# Patient Record
Sex: Male | Born: 1950 | Race: Black or African American | Hispanic: No | Marital: Married | Smoking: Former smoker
Health system: Southern US, Community
[De-identification: ages and names within clinical notes are randomized; demographics above are authoritative.]

## PROBLEM LIST (undated history)

## (undated) DIAGNOSIS — Z8719 Personal history of other diseases of the digestive system: Secondary | ICD-10-CM

## (undated) DIAGNOSIS — E7800 Pure hypercholesterolemia, unspecified: Secondary | ICD-10-CM

## (undated) DIAGNOSIS — K219 Gastro-esophageal reflux disease without esophagitis: Secondary | ICD-10-CM

## (undated) DIAGNOSIS — M4800 Spinal stenosis, site unspecified: Secondary | ICD-10-CM

## (undated) DIAGNOSIS — M1990 Unspecified osteoarthritis, unspecified site: Secondary | ICD-10-CM

## (undated) DIAGNOSIS — H409 Unspecified glaucoma: Secondary | ICD-10-CM

## (undated) DIAGNOSIS — I10 Essential (primary) hypertension: Secondary | ICD-10-CM

## (undated) HISTORY — PX: BACK SURGERY: SHX140

## (undated) HISTORY — PX: EYE SURGERY: SHX253

---

## 2008-02-20 ENCOUNTER — Inpatient Hospital Stay (HOSPITAL_COMMUNITY): Admission: AD | Admit: 2008-02-20 | Discharge: 2008-02-21 | Payer: Self-pay | Admitting: Specialist

## 2010-10-18 NOTE — Op Note (Signed)
NAME:  IZAAK, SAHR NO.:  [id number]   MEDICAL RECORD NO.:  [id number]          PATIENT TYPE:  AMB   LOCATION:  DAY                          FACILITY:  [facility]   PHYSICIAN:  Jene Every, M.D.    DATE OF BIRTH:  1950/10/27   DATE OF PROCEDURE:  02/19/2008  DATE OF DISCHARGE:                               OPERATIVE REPORT   PREOPERATIVE DIAGNOSIS:  Spinal stenosis at L4-5 and L3-4.   POSTOPERATIVE DIAGNOSIS:  Spinal stenosis at L45 and L3-4, lateral  recess at 5-1.   PROCEDURE PERFORMED:  1. Lumbar decompression centrally at L3-4 and L4-5, bilaminectomy of 4      and of 5.  2. Redo decompression at L5-S1.   ANESTHESIA:  General.   ASSISTANT:  Roma Schanz, P.A.   BRIEF HISTORY AND INDICATIONS:  The patient is a 60 year old who has had  a history of a fusion at L5-S1 and has done well with that.  Developed  neurogenic claudication secondary to adjacent spinal stenosis at 4-5  extending to 3-4 with a hyperlordotic lumbar spine.  He had  predominantly left lower extremity radicular pain in the L4 and L5 nerve  root distribution.  There was a remnant of the neural arch of 5  remaining from his previous central decompression and fusion at 5-1.  This was felt to be the area of significant pathology.  Risks and  benefits were discussed including bleeding, infection, damage to  neurovascular structures, CSF leakage, epidural fibrosis, adjacent  segment disease and the need for fusion in future, anesthetic  complications, etc.   TECHNIQUE:  The patient in supine position.  After the induction of  adequate anesthesia and 2 g of Kefzol, was placed prone on the [place]  frame.  All bony prominences were well padded.  Lumbar region was  prepped and draped in usual sterile fashion.  The previous surgical  incision was utilized partially, excised the old scar and extended  cephalad.  Subcutaneous tissue was dissected.  Electrocautery was  utilized to achieve  hemostasis.  The spinous processes of 3-4 and the  residual of 5 were identified.  Paraspinous muscles elevated.  The scar  tissue was removed which was extensive down at 4-5.  McCullough  retractors were placed.  We had an x-ray first, identifying the spinous  process of 4.  Operating microscope was draped and brought in the  surgical field.  There was shingling and overlap of the spinous process  of 4 predominantly with 5 and 3, removed the spinous process of 4 with a  Leksell rongeur.  The residual lamina of 5 was noted as well.  There was  shingling at 3-4.  After removing the spinous processes of 4, we used a  straight curette to skeletonize the lamina of 3, 4 and 5.  We used a 2-  mm Kerrison at 4-5 proceeding cephalad first centrally and then using  neural patty to protect the neural elements.  There was hypertrophic  epidural lipomatosis noted as well, and significant hypertrophic  ligamentum and facet hypertrophy at 4.  Performed central laminectomy of  4, decompressed lateral recesses to the medial border of the pedicle,  left stenotic greater than right.  Performed foraminotomies of 4,  continued cephalad removing the entire lamina of 4.  Hypertrophic  ligamentum flavum was noted at 3-4.  Ligamentum flavum was then removed  at the 3-4 space, and the lateral recesses were decompressed at the  medial border of the pedicle.  I then was able to pass a hockey stick  cephalad to the pedicle of 3 without difficulty and out the foramen of  4.  Some residual of the neural arch at 5 was remaining.  There was  significant lateral recess and foraminal stenosis, particularly at 5 and  the 5 root on the left, given the predominance of symptoms was L5  related on the left.  We continued with the resection of the neural arch  at 5 and mobilized epidural fibrosis from beneath the neural arch of 5  and then meticulously removed the residual L5 lamina with a 2-mm  Kerrison extending into the 5-1  space.  This appeared to be a napkin-  ring like effect for the adjacent segment.  There was significant  stenosis in the lateral recess.  The nerve root was protected.  We  mobilized the epidural fibrosis with a Penfield 4.  Protecting the root,  we then performed a foraminotomy of 5 and decompressed the lateral  recess to the medial border of the pedicle, freeing the root of 5.  This  was also performed on the right as well.  There was restoration of the  thecal sac and no constriction noted following the resection of 5 and  the decompression of 5-1 on the left.  Wound copiously irrigated.  Inspection revealed no active bleeding.  The hockey-stick probe passed  freely in the foramen of 5, 4, and 3 bilaterally.  There was no CSF  leakage or active bleeding.  Thrombin-soaked Gelfoam was placed in the  laminotomy defect.  McCullough retractor was removed.  Paraspinous  muscle inspected with no active bleeding with cautery utilized to  achieve hemostasis.  Wound copiously irrigated.  Repaired the dorsal  lumbar fascia with #1 Vicryl interrupted figure-of-eight sutures,  subcutaneous tissue reapproximated with 2-0 Vicryl simple sutures.  Skin  reapproximated with staples.  Wound was dressed sterilely.  Placed  supine on the hospital bed, extubated without difficulty, and  transported to recovery room in satisfactory condition.   The patient tolerated the procedure well with no complications.   BLOOD LOSS:  150 mL.      Jene Every, M.D.  Electronically Signed     JB/MEDQ  D:  02/19/2008  T:  02/20/2008  Job:  [id number]

## 2011-03-06 ENCOUNTER — Ambulatory Visit: Payer: 59 | Attending: Physician Assistant | Admitting: Physical Therapy

## 2011-03-06 DIAGNOSIS — M25676 Stiffness of unspecified foot, not elsewhere classified: Secondary | ICD-10-CM | POA: Insufficient documentation

## 2011-03-06 DIAGNOSIS — IMO0001 Reserved for inherently not codable concepts without codable children: Secondary | ICD-10-CM | POA: Insufficient documentation

## 2011-03-06 DIAGNOSIS — M25673 Stiffness of unspecified ankle, not elsewhere classified: Secondary | ICD-10-CM | POA: Insufficient documentation

## 2011-03-06 DIAGNOSIS — M25579 Pain in unspecified ankle and joints of unspecified foot: Secondary | ICD-10-CM | POA: Insufficient documentation

## 2011-03-06 LAB — BASIC METABOLIC PANEL WITH GFR
BUN: 7
CO2: 30
Calcium: 8.7
Chloride: 105
Creatinine, Ser: 1.36
GFR calc Af Amer: >60
GFR calc non Af Amer: 54 — ABNORMAL LOW
Glucose, Bld: 99
Potassium: 3.9
Sodium: 139

## 2011-03-06 LAB — CBC
HCT: 38.7 — ABNORMAL LOW
Hemoglobin: 12.9 — ABNORMAL LOW
MCHC: 33.4
MCV: 88.7
Platelets: 268
RBC: 4.36
RDW: 14.7
WBC: 10.8 — ABNORMAL HIGH

## 2011-03-08 ENCOUNTER — Ambulatory Visit: Payer: 59 | Admitting: Physical Therapy

## 2011-03-08 LAB — CBC
HCT: 45
MCHC: 33
MCV: 89.4
Platelets: 306
RDW: 14.9
WBC: 8.9

## 2011-03-08 LAB — URINALYSIS, ROUTINE W REFLEX MICROSCOPIC
Hgb urine dipstick: NEGATIVE
Ketones, ur: NEGATIVE
Protein, ur: NEGATIVE
Urobilinogen, UA: 0.2

## 2011-03-08 LAB — COMPREHENSIVE METABOLIC PANEL
AST: 24
Albumin: 3.9
BUN: 11
Calcium: 9.5
Chloride: 105
Creatinine, Ser: 1.32
GFR calc Af Amer: >60
Total Protein: 7

## 2011-03-08 LAB — PROTIME-INR: INR: 0.9

## 2011-03-08 LAB — APTT: aPTT: 31

## 2011-03-14 ENCOUNTER — Ambulatory Visit: Payer: 59 | Admitting: Physical Therapy

## 2011-03-16 ENCOUNTER — Ambulatory Visit: Payer: 59 | Admitting: Physical Therapy

## 2011-03-20 ENCOUNTER — Ambulatory Visit: Payer: 59 | Admitting: Physical Therapy

## 2011-03-22 ENCOUNTER — Ambulatory Visit: Payer: 59 | Admitting: Physical Therapy

## 2011-03-27 ENCOUNTER — Ambulatory Visit: Payer: 59 | Admitting: Physical Therapy

## 2011-03-29 ENCOUNTER — Ambulatory Visit: Payer: 59 | Admitting: Physical Therapy

## 2011-04-04 ENCOUNTER — Ambulatory Visit: Payer: 59 | Admitting: Physical Therapy

## 2011-04-07 ENCOUNTER — Ambulatory Visit: Payer: 59 | Attending: Physician Assistant | Admitting: Physical Therapy

## 2011-04-07 DIAGNOSIS — M25676 Stiffness of unspecified foot, not elsewhere classified: Secondary | ICD-10-CM | POA: Insufficient documentation

## 2011-04-07 DIAGNOSIS — IMO0001 Reserved for inherently not codable concepts without codable children: Secondary | ICD-10-CM | POA: Insufficient documentation

## 2011-04-07 DIAGNOSIS — M25673 Stiffness of unspecified ankle, not elsewhere classified: Secondary | ICD-10-CM | POA: Insufficient documentation

## 2011-04-07 DIAGNOSIS — M25579 Pain in unspecified ankle and joints of unspecified foot: Secondary | ICD-10-CM | POA: Insufficient documentation

## 2011-04-13 ENCOUNTER — Ambulatory Visit: Payer: 59 | Admitting: Physical Therapy

## 2011-04-21 ENCOUNTER — Ambulatory Visit: Payer: 59 | Admitting: Physical Therapy

## 2011-06-06 HISTORY — PX: TOE SURGERY: SHX1073

## 2012-06-05 HISTORY — PX: INJECTION KNEE: SHX2446

## 2014-02-05 ENCOUNTER — Encounter (HOSPITAL_COMMUNITY): Payer: Self-pay | Admitting: Pharmacy Technician

## 2014-02-06 ENCOUNTER — Ambulatory Visit: Payer: Self-pay | Admitting: Orthopedic Surgery

## 2014-02-11 ENCOUNTER — Ambulatory Visit: Payer: Self-pay | Admitting: Orthopedic Surgery

## 2014-02-11 ENCOUNTER — Encounter (HOSPITAL_COMMUNITY): Payer: Self-pay | Admitting: Pharmacy Technician

## 2014-02-12 ENCOUNTER — Ambulatory Visit: Payer: Self-pay | Admitting: Orthopedic Surgery

## 2014-02-12 NOTE — H&P (Signed)
Mitchell Green is an 63 y.o. male.   Chief Complaint: back and bilateral leg pain HPI: The patient is a 62 year old male who presents today for follow up of their back. The patient is being followed for their back pain. They are now 1 1/2 month out from most recent flare up. Symptoms reported today include: pain. Current treatment includes: NSAIDs (Advil). The following medication has been used for pain control: Norco (prn). The patient reports their current pain level to be moderate to severe. The patient presents today following MRI.  He has pain down both legs. It is difficult to walk. He is better when he sits. No change in bowel or bladder function, fevers or chills. He is six weeks from his flare up. He is here with his wife.  No past medical history on file.  No past surgical history on file.  No family history on file. Social History:  has no tobacco, alcohol, and drug history on file.  Allergies:  Allergies  Allergen Reactions  . Procaine     Reaction unknown.      (Not in a hospital admission)  No results found for this or any previous visit (from the past 48 hour(s)). No results found.  Review of Systems  Constitutional: Negative.   HENT: Negative.   Eyes: Negative.   Respiratory: Negative.   Cardiovascular: Negative.   Gastrointestinal: Negative.   Genitourinary: Negative.   Musculoskeletal: Positive for back pain.  Skin: Negative.   Neurological: Negative.   Psychiatric/Behavioral: Negative.     There were no vitals taken for this visit. Physical Exam  Constitutional: He is oriented to person, place, and time. He appears well-developed and well-nourished.  HENT:  Head: Normocephalic and atraumatic.  Eyes: Conjunctivae and EOM are normal. Pupils are equal, round, and reactive to light.  Neck: Normal range of motion. Neck supple.  Cardiovascular: Normal rate and regular rhythm.   Respiratory: Effort normal and breath sounds normal.  GI: Soft. Bowel  sounds are normal.  Musculoskeletal:  On exam, he is upright and sitting and in mild distress. He has pain with extension and relieved with forward flexion. Lumbar spine exam reveals no evidence of soft tissue swelling, ecchymosis or deformity. On palpation there is no flank pain with percussion. Nontender over the trochanters.  Neurological: He is alert and oriented to person, place, and time. He has normal reflexes.  Skin: Skin is warm and dry.    MRI demonstrates severe multifactorial stenosis at 3-4. Laminectomy defect at 4-5. Small disc protrusion at 4-5. Recent discectomy at 5-1 and fusion. Clumping of the nerve roots at 2-3 with consistent high grade stenosis at 3-4.  Assessment/Plan Spinal stenosis Neurogenic claudication secondary to severe spinal stenosis at 3-4 adjacent to the segment of a decompression. No instability with flexion or extension. History of a fusion at 5-1.  We discussed options in extensive detail. Over 25 minutes was dedicated to this discussion. We discussed decompression at 3-4 and possibly at 2-3. If there are any changes in the symptoms in the interim he is to call. Continue on analgesics.  The patient called and wanted to proceed with surgery. I called him at home and discussed with him the symptoms. He is mainly getting symptoms down the left leg, worse when he walks, relief when he sits. He does have stenosis at 3-4, severe. Discussed decompression at 3-4 and 2-3. He would like to start the scheduling process and we will proceed accordingly.  Dr. Shelle Iron discussed risks,  complications and alternatives with the pt including but not limited to DVT, PE, infx, bleeding, failure of procedure, need for secondary procedure, dural tear, CSF leak, complex regional pain syndrome, anesthesia risk, even death. All questions were answered and he desires to proceed.   Plan microlumbar decompression L3-4, possible L2-3 and L4-5   BISSELL, JACLYN M. PA-C for Dr.  Shelle Iron 02/12/2014, 7:21 AM

## 2014-02-13 ENCOUNTER — Ambulatory Visit (HOSPITAL_COMMUNITY)
Admission: RE | Admit: 2014-02-13 | Discharge: 2014-02-13 | Disposition: A | Discharge status: Home / Self Care | Payer: 59 | Source: Ambulatory Visit | Attending: Anesthesiology | Admitting: Anesthesiology

## 2014-02-13 ENCOUNTER — Ambulatory Visit (HOSPITAL_COMMUNITY)
Admission: RE | Admit: 2014-02-13 | Discharge: 2014-02-13 | Disposition: A | Discharge status: Home / Self Care | Payer: 59 | Source: Ambulatory Visit | Attending: Orthopedic Surgery | Admitting: Orthopedic Surgery

## 2014-02-13 ENCOUNTER — Encounter (HOSPITAL_COMMUNITY)
Admission: RE | Admit: 2014-02-13 | Discharge: 2014-02-13 | Disposition: A | Discharge status: Home / Self Care | Payer: 59 | Source: Ambulatory Visit | Attending: Specialist | Admitting: Specialist

## 2014-02-13 ENCOUNTER — Encounter (HOSPITAL_COMMUNITY): Payer: Self-pay

## 2014-02-13 DIAGNOSIS — I10 Essential (primary) hypertension: Secondary | ICD-10-CM | POA: Insufficient documentation

## 2014-02-13 DIAGNOSIS — Z01818 Encounter for other preprocedural examination: Secondary | ICD-10-CM | POA: Diagnosis not present

## 2014-02-13 HISTORY — DX: Essential (primary) hypertension: I10

## 2014-02-13 HISTORY — DX: Unspecified glaucoma: H40.9

## 2014-02-13 HISTORY — DX: Pure hypercholesterolemia, unspecified: E78.00

## 2014-02-13 HISTORY — DX: Personal history of other diseases of the digestive system: Z87.19

## 2014-02-13 HISTORY — DX: Spinal stenosis, site unspecified: M48.00

## 2014-02-13 HISTORY — DX: Gastro-esophageal reflux disease without esophagitis: K21.9

## 2014-02-13 LAB — CBC
HEMATOCRIT: 42.7 % (ref 39.0–52.0)
HEMOGLOBIN: 14.8 g/dL (ref 13.0–17.0)
MCH: 29.4 pg (ref 26.0–34.0)
MCHC: 34.7 g/dL (ref 30.0–36.0)
MCV: 84.9 fL (ref 78.0–100.0)
Platelets: 327 10*3/uL (ref 150–400)
RBC: 5.03 MIL/uL (ref 4.22–5.81)
RDW: 14.9 % (ref 11.5–15.5)
WBC: 6.1 10*3/uL (ref 4.0–10.5)

## 2014-02-13 LAB — BASIC METABOLIC PANEL
ANION GAP: 13 (ref 5–15)
BUN: 18 mg/dL (ref 6–23)
CHLORIDE: 101 meq/L (ref 96–112)
CO2: 26 meq/L (ref 19–32)
Calcium: 9.8 mg/dL (ref 8.4–10.5)
Creatinine, Ser: 1.23 mg/dL (ref 0.50–1.35)
GFR calc non Af Amer: 60 mL/min — ABNORMAL LOW (ref >90)
GFR, EST AFRICAN AMERICAN: 70 mL/min — AB (ref >90)
Glucose, Bld: 100 mg/dL — ABNORMAL HIGH (ref 70–99)
Potassium: 3.8 mEq/L (ref 3.7–5.3)
SODIUM: 140 meq/L (ref 137–147)

## 2014-02-13 LAB — SURGICAL PCR SCREEN
MRSA, PCR: NEGATIVE
Staphylococcus aureus: NEGATIVE

## 2014-02-13 NOTE — Progress Notes (Signed)
02-13-14 1635 EKG done today  shown to Dr. Okey Dupre with anesthesia. He wants the pt to be seen by cardiology prior to surgery.  Spoke with Andrez Grime PA at [facility] and she will talk to Rosalva Ferron to plan cardiology visit. [facility] will speak to pt.

## 2014-02-13 NOTE — Anesthesia Preprocedure Evaluation (Addendum)
Anesthesia Evaluation  Patient identified by MRN, date of birth, ID band Patient awake    Reviewed: Allergy & Precautions, H&P , NPO status , Patient's Chart, lab work & pertinent test results  Airway Mallampati: II TM Distance: >3 FB Neck ROM: Full    Dental no notable dental hx.    Pulmonary neg pulmonary ROS, former smoker,  breath sounds clear to auscultation  Pulmonary exam normal       Cardiovascular hypertension, Pt. on medications Rhythm:Regular Rate:Normal     Neuro/Psych negative neurological ROS  negative psych ROS   GI/Hepatic Neg liver ROS, hiatal hernia, GERD-  ,  Endo/Other  negative endocrine ROS  Renal/GU negative Renal ROS     Musculoskeletal negative musculoskeletal ROS (+)   Abdominal   Peds  Hematology negative hematology ROS (+)   Anesthesia Other Findings   Reproductive/Obstetrics negative OB ROS                          Anesthesia Physical Anesthesia Plan  ASA: II  Anesthesia Plan: General   Post-op Pain Management:    Induction: Intravenous  Airway Management Planned: Oral ETT  Additional Equipment:   Intra-op Plan:   Post-operative Plan: Extubation in OR  Informed Consent: I have reviewed the patients History and Physical, chart, labs and discussed the procedure including the risks, benefits and alternatives for the proposed anesthesia with the patient or authorized representative who has indicated his/her understanding and acceptance.   Dental advisory given  Plan Discussed with: CRNA  Anesthesia Plan Comments:         Anesthesia Quick Evaluation

## 2014-02-13 NOTE — Patient Instructions (Signed)
19 Mitchell Green  02/13/2014   Your procedure is scheduled on: Thursday 02/19/14  Report to [facility] at 05:30 AM.  Call this number if you have problems the morning of surgery [phone]: [phone]   Remember:   Do not eat food or drink liquids After Midnight.     Take these medicines the morning of surgery with A SIP OF WATER: lipitor, hydrocodone if needed   Do not wear jewelry, make-up or nail polish.  Do not wear lotions, powders, or perfumes. You may wear deodorant.  Do not shave 48 hours prior to surgery. Men may shave face and neck.  Do not bring valuables to the hospital.  Contacts, dentures or bridgework may not be worn into surgery.  Leave suitcase in the car. After surgery it may be brought to your room.  For patients admitted to the hospital, checkout time is 11:00 AM the day of discharge.   Please read over the following fact sheets that you were given: MRSA Information  Birdie Sons, RN  pre op nurse call if needed [phone]    [facility] - Preparing for Surgery Before surgery, you can play an important role.  Because skin is not sterile, your skin needs to be as free of germs as possible.  You can reduce the number of germs on your skin by washing with CHG (chlorahexidine gluconate) soap before surgery.  CHG is an antiseptic cleaner which kills germs and bonds with the skin to continue killing germs even after washing. Please DO NOT use if you have an allergy to CHG or antibacterial soaps.  If your skin becomes reddened/irritated stop using the CHG and inform your nurse when you arrive at Short Stay. Do not shave (including legs and underarms) for at least 48 hours prior to the first CHG shower.  You may shave your face/neck. Please follow these instructions carefully:  1.  Shower with CHG Soap the night before surgery and the  morning of Surgery.  2.  If you choose to wash your hair, wash your hair first as usual with your  normal  shampoo.  3.   After you shampoo, rinse your hair and body thoroughly to remove the  shampoo.                            4.  Use CHG as you would any other liquid soap.  You can apply chg directly  to the skin and wash                       Gently with a scrungie or clean washcloth.  5.  Apply the CHG Soap to your body ONLY FROM THE NECK DOWN.   Do not use on face/ open                           Wound or open sores. Avoid contact with eyes, ears mouth and genitals (private parts).                       Wash face,  Genitals (private parts) with your normal soap.             6.  Wash thoroughly, paying special attention to the area where your surgery  will be performed.  7.  Thoroughly rinse your body with warm water from the neck down.  8.  DO NOT shower/wash with your normal soap after using and rinsing off  the CHG Soap.                9.  Pat yourself dry with a clean towel.            10.  Wear clean pajamas.            11.  Place clean sheets on your bed the night of your first shower and do not  sleep with pets. Day of Surgery : Do not apply any lotions the morning of surgery.  Please wear clean clothes to the hospital/surgery center.  FAILURE TO FOLLOW THESE INSTRUCTIONS MAY RESULT IN THE CANCELLATION OF YOUR SURGERY PATIENT SIGNATURE_________________________________  NURSE SIGNATURE__________________________________  ________________________________________________________________________   Mitchell Green  An incentive spirometer is a tool that can help keep your lungs clear and active. This tool measures how well you are filling your lungs with each breath. Taking long deep breaths may help reverse or decrease the chance of developing breathing (pulmonary) problems (especially infection) following:  A long period of time when you are unable to move or be active. BEFORE THE PROCEDURE   If the spirometer includes an indicator to show your best effort, your nurse or respiratory therapist  will set it to a desired goal.  If possible, sit up straight or lean slightly forward. Try not to slouch.  Hold the incentive spirometer in an upright position. INSTRUCTIONS FOR USE  1. Sit on the edge of your bed if possible, or sit up as far as you can in bed or on a chair. 2. Hold the incentive spirometer in an upright position. 3. Breathe out normally. 4. Place the mouthpiece in your mouth and seal your lips tightly around it. 5. Breathe in slowly and as deeply as possible, raising the piston or the ball toward the top of the column. 6. Hold your breath for 3-5 seconds or for as long as possible. Allow the piston or ball to fall to the bottom of the column. 7. Remove the mouthpiece from your mouth and breathe out normally. 8. Rest for a few seconds and repeat Steps 1 through 7 at least 10 times every 1-2 hours when you are awake. Take your time and take a few normal breaths between deep breaths. 9. The spirometer may include an indicator to show your best effort. Use the indicator as a goal to work toward during each repetition. 10. After each set of 10 deep breaths, practice coughing to be sure your lungs are clear. If you have an incision (the cut made at the time of surgery), support your incision when coughing by placing a pillow or rolled up towels firmly against it. Once you are able to get out of bed, walk around indoors and cough well. You may stop using the incentive spirometer when instructed by your caregiver.  RISKS AND COMPLICATIONS  Take your time so you do not get dizzy or light-headed.  If you are in pain, you may need to take or ask for pain medication before doing incentive spirometry. It is harder to take a deep breath if you are having pain. AFTER USE  Rest and breathe slowly and easily.  It can be helpful to keep track of a log of your progress. Your caregiver can provide you with a simple table to help with this. If you are using the spirometer at home, follow  these instructions: SEEK MEDICAL CARE IF:  You are having difficultly using the spirometer.  You have trouble using the spirometer as often as instructed.  Your pain medication is not giving enough relief while using the spirometer.  You develop fever of 100.5 F (38.1 C) or higher. SEEK IMMEDIATE MEDICAL CARE IF:   You cough up bloody sputum that had not been present before.  You develop fever of 102 F (38.9 C) or greater.  You develop worsening pain at or near the incision site. MAKE SURE YOU:   Understand these instructions.  Will watch your condition.  Will get help right away if you are not doing well or get worse. Document Released: 10/02/2006 Document Revised: 08/14/2011 Document Reviewed: 12/03/2006 [facility] Patient Information 2014 [place], [place].   ________________________________________________________________________

## 2014-02-16 ENCOUNTER — Ambulatory Visit (INDEPENDENT_AMBULATORY_CARE_PROVIDER_SITE_OTHER): Payer: 59 | Admitting: Cardiovascular Disease

## 2014-02-16 ENCOUNTER — Ambulatory Visit (HOSPITAL_COMMUNITY): Payer: 59 | Attending: Internal Medicine | Admitting: Radiology

## 2014-02-16 ENCOUNTER — Encounter: Payer: Self-pay | Admitting: Cardiovascular Disease

## 2014-02-16 VITALS — BP 140/80 | HR 71 | Ht 70.0 in | Wt 209.0 lb

## 2014-02-16 DIAGNOSIS — I517 Cardiomegaly: Secondary | ICD-10-CM | POA: Insufficient documentation

## 2014-02-16 DIAGNOSIS — I079 Rheumatic tricuspid valve disease, unspecified: Secondary | ICD-10-CM | POA: Insufficient documentation

## 2014-02-16 DIAGNOSIS — I10 Essential (primary) hypertension: Secondary | ICD-10-CM

## 2014-02-16 DIAGNOSIS — Z01810 Encounter for preprocedural cardiovascular examination: Secondary | ICD-10-CM

## 2014-02-16 DIAGNOSIS — Z87891 Personal history of nicotine dependence: Secondary | ICD-10-CM | POA: Insufficient documentation

## 2014-02-16 DIAGNOSIS — I059 Rheumatic mitral valve disease, unspecified: Secondary | ICD-10-CM | POA: Diagnosis not present

## 2014-02-16 DIAGNOSIS — Z01818 Encounter for other preprocedural examination: Secondary | ICD-10-CM | POA: Insufficient documentation

## 2014-02-16 DIAGNOSIS — E785 Hyperlipidemia, unspecified: Secondary | ICD-10-CM

## 2014-02-16 DIAGNOSIS — K219 Gastro-esophageal reflux disease without esophagitis: Secondary | ICD-10-CM | POA: Diagnosis not present

## 2014-02-16 DIAGNOSIS — M4800 Spinal stenosis, site unspecified: Secondary | ICD-10-CM | POA: Diagnosis not present

## 2014-02-16 DIAGNOSIS — F17201 Nicotine dependence, unspecified, in remission: Secondary | ICD-10-CM

## 2014-02-16 NOTE — Patient Instructions (Signed)
Your physician recommends that you schedule a follow-up appointment  As needed with Dr. McAlhany  Your physician has requested that you have an echocardiogram. Echocardiography is a painless test that uses sound waves to create images of your heart. It provides your doctor with information about the size and shape of your heart and how well your heart's chambers and valves are working. This procedure takes approximately one hour. There are no restrictions for this procedure.    

## 2014-02-16 NOTE — Progress Notes (Signed)
Echocardiogram performed.  

## 2014-02-16 NOTE — Progress Notes (Signed)
History of Present Illness: 63 yo male with history of HTN, HLD, GERD, hiatal hernia, spinal stenosis who is here today as a new patient for cardiac risk assessment prior to planned back surgery. He has had no prior cardiac conditions. He has been very active and had no chest pain or SOB. He is limited by back pain and had planned back surgery Thursday of this week. He had an EKG last week in pre-op showing non-specific T wave changes. He has been very active his entire life and is limited by his back pain due to spinal stenosis. No chest pain or SOB. Prior stress testing for his job and has always been normal per pt.   Primary Care Physician: Cindie [facility] Orthopedic Surgery: Dr. Shelle Iron   Past Medical History  Diagnosis Date  . H/O hiatal hernia   . GERD (gastroesophageal reflux disease)   . Hypertension   . Hypercholesteremia     under control  . Spinal stenosis   . Glaucoma     Past Surgical History  Procedure Laterality Date  . Back surgery  last 2009    x3-bulging disc, then disc collapsed=fusion with hip bone transplant, scar tissue  . Toe surgery Left 2013    big toe-break and set back with metal pin  . Injection knee Left 2014    Current Outpatient Prescriptions  Medication Sig Dispense Refill  . acetaminophen (TYLENOL) 500 MG tablet Take 1,000 mg by mouth every 6 (six) hours as needed for moderate pain.      . Artificial Tear Solution (SOOTHE XP OP) Apply 1 drop to eye as needed (two or three times daily if needed).      Marland Kitchen atorvastatin (LIPITOR) 40 MG tablet Take 40 mg by mouth every morning.      Marland Kitchen HYDROcodone-acetaminophen (NORCO/VICODIN) 5-325 MG per tablet Take 1 tablet by mouth every 6 (six) hours as needed for moderate pain.      Marland Kitchen OVER THE COUNTER MEDICATION Take 1 packet by mouth daily. Replenex Extra Strength.      . Propylene Glycol (SYSTANE BALANCE) 0.6 % SOLN Apply 1 drop to eye 3 (three) times daily as needed (dry eyes/gluacoma.).      Marland Kitchen  triamterene-hydrochlorothiazide (DYAZIDE) 37.5-25 MG per capsule Take 1 capsule by mouth every morning.      Marland Kitchen aspirin EC 81 MG tablet Take 81 mg by mouth every morning. ON HOLD       No current facility-administered medications for this visit.    Allergies  Allergen Reactions  . Procaine     Reaction unknown.     History   Social History  . Marital Status: Married    Spouse Name: N/A    Number of Children: 3  . Years of Education: N/A   Occupational History  . Retired [employer]    Social History Main Topics  . Smoking status: Former Smoker -- 0.50 packs/day for 20 years    Types: Cigarettes    Quit date: 06/06/2007  . Smokeless tobacco: Never Used  . Alcohol Use: Yes     Comment: social  . Drug Use: No  . Sexual Activity: Not on file   Other Topics Concern  . Not on file   Social History Narrative  . No narrative on file    Family History  Problem Relation Age of Onset  . Heart attack Brother 47    Review of Systems:  As stated in the HPI and otherwise negative.  BP 140/80  Pulse 71  Ht  (1.778 m)  Wt 209 lb (94.802 kg)  BMI 29.99 kg/m2  Physical Examination: General: Well developed, well nourished, NAD HEENT: OP clear, mucus membranes moist SKIN: warm, dry. No rashes. Neuro: No focal deficits Musculoskeletal: Muscle strength 5/5 all ext Psychiatric: Mood and affect normal Neck: No JVD, no carotid bruits, no thyromegaly, no lymphadenopathy. Lungs:Clear bilaterally, no wheezes, rhonci, crackles Cardiovascular: Regular rate and rhythm. No murmurs, gallops or rubs. Abdomen:Soft. Bowel sounds present. Non-tender.  Extremities: No lower extremity edema. Pulses are 2 + in the bilateral DP/PT.  EKG: NSR, rate 71 bpm. Non-specific ST and T wave abnormalities.   Assessment and Plan:   1. Pre-operative cardiovascular examination/risk assessment: He has no symptoms of CAD/unstable angina, CHF, arrythmias. His EKG shows non-specific abnormalities.  No old  EKG for review for comparison. EKG from 9/11/5 and 02/16/14 are the same. I do not think stress testing is indicated. I will get an echo to assess his LV function. If LV function is normal, can proceed with surgery later this week. I will write a letter after I review his echo.   2. HTN: BP is well controlled  3. HLD: He is on a statin  4. Tobacco abuse, in remission: He no longer smokes

## 2014-02-17 ENCOUNTER — Encounter: Payer: Self-pay | Admitting: Cardiovascular Disease

## 2014-02-17 ENCOUNTER — Telehealth: Payer: Self-pay | Admitting: Cardiovascular Disease

## 2014-02-17 ENCOUNTER — Telehealth: Payer: Self-pay | Admitting: *Deleted

## 2014-02-17 NOTE — Progress Notes (Signed)
EKG and ECHO done 02/16/14 on [facility], 02/16/14 pre-op exam note Dr. Clifton James on [facility]

## 2014-02-17 NOTE — Telephone Encounter (Signed)
Received request from Nurse fax box, documents faxed for surgical clearance. °To: [facility] °Fax number: [phone] °Attention: °9.15.15/km °

## 2014-02-17 NOTE — Telephone Encounter (Signed)
Informed pt that per Dr Clifton James, the pts echo from 02/16/14 was normal with normal left ventricular function and no valve issues.  Informed the pt that I will fax his clearance letter for surgery and consultation note to pts orthopedic office, Dr Shelle Iron with [facility] at [phone] today 02/17/14.  Pt verbalized understanding and pleased with this news.

## 2014-02-18 NOTE — Progress Notes (Signed)
Cardiac clearance note dr Clifton James received by fax and placed on pt chart for 02-19-14 surgery.

## 2014-02-19 ENCOUNTER — Encounter (HOSPITAL_COMMUNITY): Payer: Self-pay | Admitting: *Deleted

## 2014-02-19 ENCOUNTER — Encounter (HOSPITAL_COMMUNITY): Payer: 59 | Admitting: Anesthesiology

## 2014-02-19 ENCOUNTER — Ambulatory Visit (HOSPITAL_COMMUNITY): Payer: 59

## 2014-02-19 ENCOUNTER — Ambulatory Visit (HOSPITAL_COMMUNITY): Payer: 59 | Admitting: Anesthesiology

## 2014-02-19 ENCOUNTER — Encounter (HOSPITAL_COMMUNITY)
Admission: RE | Disposition: A | Discharge status: Home / Self Care | Payer: Self-pay | Source: Ambulatory Visit | Attending: Specialist

## 2014-02-19 ENCOUNTER — Ambulatory Visit (HOSPITAL_COMMUNITY)
Admission: RE | Admit: 2014-02-19 | Discharge: 2014-02-20 | Disposition: A | Discharge status: Home / Self Care | Payer: 59 | Source: Ambulatory Visit | Attending: Specialist | Admitting: Specialist

## 2014-02-19 DIAGNOSIS — M48061 Spinal stenosis, lumbar region without neurogenic claudication: Secondary | ICD-10-CM | POA: Diagnosis not present

## 2014-02-19 DIAGNOSIS — Z888 Allergy status to other drugs, medicaments and biological substances status: Secondary | ICD-10-CM | POA: Insufficient documentation

## 2014-02-19 DIAGNOSIS — K219 Gastro-esophageal reflux disease without esophagitis: Secondary | ICD-10-CM | POA: Diagnosis not present

## 2014-02-19 DIAGNOSIS — I10 Essential (primary) hypertension: Secondary | ICD-10-CM | POA: Diagnosis not present

## 2014-02-19 DIAGNOSIS — H409 Unspecified glaucoma: Secondary | ICD-10-CM | POA: Insufficient documentation

## 2014-02-19 DIAGNOSIS — Z981 Arthrodesis status: Secondary | ICD-10-CM | POA: Insufficient documentation

## 2014-02-19 DIAGNOSIS — M4800 Spinal stenosis, site unspecified: Secondary | ICD-10-CM | POA: Diagnosis present

## 2014-02-19 DIAGNOSIS — E7800 Pure hypercholesterolemia, unspecified: Secondary | ICD-10-CM | POA: Diagnosis not present

## 2014-02-19 HISTORY — PX: LUMBAR LAMINECTOMY/DECOMPRESSION MICRODISCECTOMY: SHX5026

## 2014-02-19 SURGERY — LUMBAR LAMINECTOMY/DECOMPRESSION MICRODISCECTOMY 3 LEVELS
Anesthesia: General | Site: Back

## 2014-02-19 MED ORDER — HYDROMORPHONE HCL 1 MG/ML IJ SOLN
0.2500 mg | INTRAMUSCULAR | Status: DC | PRN
  Administered 2014-02-19 (×4): 0.5 mg via INTRAVENOUS

## 2014-02-19 MED ORDER — OXYCODONE HCL 5 MG/5ML PO SOLN
5.0000 mg | Freq: Once | ORAL | Status: DC | PRN
  Filled 2014-02-19: qty 5

## 2014-02-19 MED ORDER — LACTATED RINGERS IV SOLN
INTRAVENOUS | Status: DC
  Administered 2014-02-19 (×3): via INTRAVENOUS

## 2014-02-19 MED ORDER — MAGNESIUM CITRATE PO SOLN: 1.0000 | Freq: Once | ORAL | Status: AC | PRN

## 2014-02-19 MED ORDER — PROPYLENE GLYCOL 0.6 % OP SOLN: 1.0000 [drp] | Freq: Three times a day (TID) | OPHTHALMIC | Status: DC | PRN

## 2014-02-19 MED ORDER — ONDANSETRON HCL 4 MG/2ML IJ SOLN
INTRAMUSCULAR | Status: AC
  Filled 2014-02-19: qty 2

## 2014-02-19 MED ORDER — METOCLOPRAMIDE HCL 5 MG/ML IJ SOLN
INTRAMUSCULAR | Status: AC
  Filled 2014-02-19: qty 2

## 2014-02-19 MED ORDER — SODIUM CHLORIDE 0.9 % IR SOLN
Status: DC | PRN
  Administered 2014-02-19: 08:00:00

## 2014-02-19 MED ORDER — BUPIVACAINE-EPINEPHRINE 0.5% -1:200000 IJ SOLN
INTRAMUSCULAR | Status: DC | PRN
  Administered 2014-02-19: 15 mL

## 2014-02-19 MED ORDER — PHENYLEPHRINE HCL 10 MG/ML IJ SOLN
10.0000 mg | INTRAVENOUS | Status: DC | PRN
  Administered 2014-02-19: 20 ug/min via INTRAVENOUS

## 2014-02-19 MED ORDER — MIDAZOLAM HCL 5 MG/5ML IJ SOLN
INTRAMUSCULAR | Status: DC | PRN
  Administered 2014-02-19: 2 mg via INTRAVENOUS

## 2014-02-19 MED ORDER — METOCLOPRAMIDE HCL 5 MG/ML IJ SOLN
INTRAMUSCULAR | Status: DC | PRN
  Administered 2014-02-19: 10 mg via INTRAVENOUS

## 2014-02-19 MED ORDER — HYDROMORPHONE HCL 1 MG/ML IJ SOLN
INTRAMUSCULAR | Status: AC
  Filled 2014-02-19: qty 1

## 2014-02-19 MED ORDER — ACETAMINOPHEN 325 MG PO TABS: 650.0000 mg | ORAL_TABLET | ORAL | Status: DC | PRN

## 2014-02-19 MED ORDER — GELATIN ABSORBABLE MT POWD
OROMUCOSAL | Status: DC | PRN
  Administered 2014-02-19: 09:00:00 via TOPICAL

## 2014-02-19 MED ORDER — METHOCARBAMOL 500 MG PO TABS
500.0000 mg | ORAL_TABLET | Freq: Four times a day (QID) | ORAL | Status: DC | PRN
  Administered 2014-02-20: 500 mg via ORAL
  Filled 2014-02-19: qty 1

## 2014-02-19 MED ORDER — PROPOFOL 10 MG/ML IV BOLUS
INTRAVENOUS | Status: DC | PRN
  Administered 2014-02-19: 150 mg via INTRAVENOUS

## 2014-02-19 MED ORDER — BISACODYL 5 MG PO TBEC: 5.0000 mg | DELAYED_RELEASE_TABLET | Freq: Every day | ORAL | Status: DC | PRN

## 2014-02-19 MED ORDER — ONDANSETRON HCL 4 MG/2ML IJ SOLN
INTRAMUSCULAR | Status: DC | PRN
  Administered 2014-02-19: 4 mg via INTRAVENOUS

## 2014-02-19 MED ORDER — DEXAMETHASONE SODIUM PHOSPHATE 10 MG/ML IJ SOLN
INTRAMUSCULAR | Status: AC
  Filled 2014-02-19: qty 1

## 2014-02-19 MED ORDER — SODIUM CHLORIDE 0.9 % IJ SOLN: 3.0000 mL | Freq: Two times a day (BID) | INTRAMUSCULAR | Status: DC

## 2014-02-19 MED ORDER — OXYCODONE-ACETAMINOPHEN 5-325 MG PO TABS
1.0000 | ORAL_TABLET | ORAL | Status: DC | PRN
  Administered 2014-02-19 (×2): 2 via ORAL
  Administered 2014-02-19: 1 via ORAL
  Administered 2014-02-20 (×2): 2 via ORAL
  Filled 2014-02-19: qty 2
  Filled 2014-02-19: qty 1
  Filled 2014-02-19 (×3): qty 2

## 2014-02-19 MED ORDER — DEXAMETHASONE SODIUM PHOSPHATE 10 MG/ML IJ SOLN
INTRAMUSCULAR | Status: DC | PRN
  Administered 2014-02-19: 10 mg via INTRAVENOUS

## 2014-02-19 MED ORDER — SODIUM CHLORIDE 0.9 % IR SOLN
Status: AC
  Filled 2014-02-19: qty 1

## 2014-02-19 MED ORDER — LACTATED RINGERS IV SOLN: INTRAVENOUS | Status: DC

## 2014-02-19 MED ORDER — SODIUM CHLORIDE 0.9 % IJ SOLN: 3.0000 mL | INTRAMUSCULAR | Status: DC | PRN

## 2014-02-19 MED ORDER — TRIAMTERENE-HCTZ 37.5-25 MG PO CAPS
1.0000 | ORAL_CAPSULE | Freq: Every morning | ORAL | Status: DC
  Filled 2014-02-19: qty 1

## 2014-02-19 MED ORDER — DOCUSATE SODIUM 100 MG PO CAPS
100.0000 mg | ORAL_CAPSULE | Freq: Two times a day (BID) | ORAL | Status: DC
  Administered 2014-02-19 – 2014-02-20 (×2): 100 mg via ORAL

## 2014-02-19 MED ORDER — SENNOSIDES-DOCUSATE SODIUM 8.6-50 MG PO TABS: 1.0000 | ORAL_TABLET | Freq: Every evening | ORAL | Status: DC | PRN

## 2014-02-19 MED ORDER — METHOCARBAMOL 1000 MG/10ML IJ SOLN
500.0000 mg | Freq: Four times a day (QID) | INTRAMUSCULAR | Status: DC | PRN
  Administered 2014-02-19: 500 mg via INTRAVENOUS
  Filled 2014-02-19: qty 5

## 2014-02-19 MED ORDER — FENTANYL CITRATE 0.05 MG/ML IJ SOLN
INTRAMUSCULAR | Status: DC | PRN
  Administered 2014-02-19 (×3): 50 ug via INTRAVENOUS

## 2014-02-19 MED ORDER — KCL IN DEXTROSE-NACL 20-5-0.45 MEQ/L-%-% IV SOLN
INTRAVENOUS | Status: AC
  Administered 2014-02-19: 13:00:00 via INTRAVENOUS
  Filled 2014-02-19 (×2): qty 1000

## 2014-02-19 MED ORDER — BUPIVACAINE-EPINEPHRINE (PF) 0.5% -1:200000 IJ SOLN
INTRAMUSCULAR | Status: AC
  Filled 2014-02-19: qty 30

## 2014-02-19 MED ORDER — POLYVINYL ALCOHOL 1.4 % OP SOLN
1.0000 [drp] | Freq: Three times a day (TID) | OPHTHALMIC | Status: DC | PRN
  Administered 2014-02-19 – 2014-02-20 (×2): 1 [drp] via OPHTHALMIC
  Filled 2014-02-19: qty 15

## 2014-02-19 MED ORDER — CEFAZOLIN SODIUM-DEXTROSE 2-3 GM-% IV SOLR
2.0000 g | Freq: Three times a day (TID) | INTRAVENOUS | Status: AC
  Administered 2014-02-19 – 2014-02-20 (×3): 2 g via INTRAVENOUS
  Filled 2014-02-19 (×3): qty 50

## 2014-02-19 MED ORDER — MEPERIDINE HCL 50 MG/ML IJ SOLN
6.2500 mg | INTRAMUSCULAR | Status: DC | PRN
Start: 2014-02-19 — End: 2014-02-19

## 2014-02-19 MED ORDER — OXYCODONE-ACETAMINOPHEN 7.5-325 MG PO TABS: 1.0000 | ORAL_TABLET | ORAL | Status: DC | PRN

## 2014-02-19 MED ORDER — SODIUM CHLORIDE 0.9 % IV SOLN: 250.0000 mL | INTRAVENOUS | Status: DC

## 2014-02-19 MED ORDER — TRIAMTERENE-HCTZ 37.5-25 MG PO TABS
1.0000 | ORAL_TABLET | Freq: Every day | ORAL | Status: DC
  Administered 2014-02-19 – 2014-02-20 (×2): 1 via ORAL
  Filled 2014-02-19 (×2): qty 1

## 2014-02-19 MED ORDER — PROPOFOL 10 MG/ML IV BOLUS
INTRAVENOUS | Status: AC
  Filled 2014-02-19: qty 20

## 2014-02-19 MED ORDER — SUCCINYLCHOLINE CHLORIDE 20 MG/ML IJ SOLN
INTRAMUSCULAR | Status: DC | PRN
  Administered 2014-02-19: 100 mg via INTRAVENOUS

## 2014-02-19 MED ORDER — DOCUSATE SODIUM 100 MG PO CAPS: 100.0000 mg | ORAL_CAPSULE | Freq: Two times a day (BID) | ORAL | Status: DC | PRN

## 2014-02-19 MED ORDER — PHENOL 1.4 % MT LIQD: 1.0000 | OROMUCOSAL | Status: DC | PRN

## 2014-02-19 MED ORDER — MENTHOL 3 MG MT LOZG
1.0000 | LOZENGE | OROMUCOSAL | Status: DC | PRN
  Filled 2014-02-19: qty 9

## 2014-02-19 MED ORDER — CEFAZOLIN SODIUM-DEXTROSE 2-3 GM-% IV SOLR
INTRAVENOUS | Status: AC
  Filled 2014-02-19: qty 50

## 2014-02-19 MED ORDER — HYDROCODONE-ACETAMINOPHEN 5-325 MG PO TABS: 1.0000 | ORAL_TABLET | ORAL | Status: DC | PRN

## 2014-02-19 MED ORDER — ONDANSETRON HCL 4 MG/2ML IJ SOLN: 4.0000 mg | INTRAMUSCULAR | Status: DC | PRN

## 2014-02-19 MED ORDER — HYDROMORPHONE HCL 1 MG/ML IJ SOLN
0.5000 mg | INTRAMUSCULAR | Status: DC | PRN
  Administered 2014-02-19: 1 mg via INTRAVENOUS
  Administered 2014-02-19: 0.5 mg via INTRAVENOUS
  Filled 2014-02-19 (×2): qty 1

## 2014-02-19 MED ORDER — FENTANYL CITRATE 0.05 MG/ML IJ SOLN
INTRAMUSCULAR | Status: AC
  Filled 2014-02-19: qty 5

## 2014-02-19 MED ORDER — PROMETHAZINE HCL 25 MG/ML IJ SOLN: 6.2500 mg | INTRAMUSCULAR | Status: DC | PRN

## 2014-02-19 MED ORDER — CEFAZOLIN SODIUM-DEXTROSE 2-3 GM-% IV SOLR
2.0000 g | INTRAVENOUS | Status: AC
  Administered 2014-02-19: 2 g via INTRAVENOUS

## 2014-02-19 MED ORDER — OXYCODONE HCL 5 MG PO TABS: 5.0000 mg | ORAL_TABLET | Freq: Once | ORAL | Status: DC | PRN

## 2014-02-19 MED ORDER — THROMBIN 5000 UNITS EX SOLR
CUTANEOUS | Status: AC
  Filled 2014-02-19: qty 10000

## 2014-02-19 MED ORDER — ALUM & MAG HYDROXIDE-SIMETH 200-200-20 MG/5ML PO SUSP
30.0000 mL | Freq: Four times a day (QID) | ORAL | Status: DC | PRN
  Administered 2014-02-19 – 2014-02-20 (×3): 30 mL via ORAL
  Filled 2014-02-19 (×3): qty 30

## 2014-02-19 MED ORDER — ACETAMINOPHEN 650 MG RE SUPP: 650.0000 mg | RECTAL | Status: DC | PRN

## 2014-02-19 MED ORDER — PHENYLEPHRINE HCL 10 MG/ML IJ SOLN
INTRAMUSCULAR | Status: DC | PRN
  Administered 2014-02-19 (×2): 120 ug via INTRAVENOUS

## 2014-02-19 MED ORDER — METHOCARBAMOL 500 MG PO TABS: 500.0000 mg | ORAL_TABLET | Freq: Three times a day (TID) | ORAL | Status: DC | PRN

## 2014-02-19 MED ORDER — MIDAZOLAM HCL 2 MG/2ML IJ SOLN
INTRAMUSCULAR | Status: AC
  Filled 2014-02-19: qty 2

## 2014-02-19 SURGICAL SUPPLY — 38 items
BAG SPEC THK2 15X12 ZIP CLS (MISCELLANEOUS)
BAG ZIPLOCK 12X15 (MISCELLANEOUS) IMPLANT
CLEANER TIP ELECTROSURG 2X2 (MISCELLANEOUS) ×2 IMPLANT
CLOTH 2% CHLOROHEXIDINE 3PK (PERSONAL CARE ITEMS) ×2 IMPLANT
DRAPE MICROSCOPE LEICA (MISCELLANEOUS) ×2 IMPLANT
DRAPE POUCH INSTRU U-SHP 10X18 (DRAPES) ×2 IMPLANT
DRAPE SURG 17X11 SM STRL (DRAPES) ×2 IMPLANT
DRAPE UTILITY XL STRL (DRAPES) ×2 IMPLANT
DRSG AQUACEL AG ADV 3.5X 4 (GAUZE/BANDAGES/DRESSINGS) IMPLANT
DRSG AQUACEL AG ADV 3.5X 6 (GAUZE/BANDAGES/DRESSINGS) ×1 IMPLANT
DURAPREP 26ML APPLICATOR (WOUND CARE) ×2 IMPLANT
ELECT BLADE TIP CTD 4 INCH (ELECTRODE) ×1 IMPLANT
GLOVE BIOGEL PI IND STRL 7.5 (GLOVE) ×1 IMPLANT
GLOVE BIOGEL PI INDICATOR 7.5 (GLOVE) ×1
GLOVE SURG SS PI 7.5 STRL IVOR (GLOVE) ×2 IMPLANT
GLOVE SURG SS PI 8.0 STRL IVOR (GLOVE) ×4 IMPLANT
GOWN STRL REUS W/TWL XL LVL3 (GOWN DISPOSABLE) ×4 IMPLANT
KIT BASIN OR (CUSTOM PROCEDURE TRAY) ×2 IMPLANT
KIT POSITIONING SURG ANDREWS (MISCELLANEOUS) ×2 IMPLANT
MANIFOLD NEPTUNE II (INSTRUMENTS) ×2 IMPLANT
NDL SPNL 18GX3.5 QUINCKE PK (NEEDLE) ×2 IMPLANT
NEEDLE SPNL 18GX3.5 QUINCKE PK (NEEDLE) ×4 IMPLANT
SPONGE SURGIFOAM ABS GEL 100 (HEMOSTASIS) ×2 IMPLANT
STAPLER VISISTAT (STAPLE) ×1 IMPLANT
STRIP CLOSURE SKIN 1/2X4 (GAUZE/BANDAGES/DRESSINGS) ×1 IMPLANT
SUT PROLENE 3 0 PS 2 (SUTURE) ×2 IMPLANT
SUT VIC AB 1 CT1 27 (SUTURE) ×4
SUT VIC AB 1 CT1 27XBRD ANTBC (SUTURE) IMPLANT
SUT VIC AB 1-0 CT2 27 (SUTURE) ×2 IMPLANT
SUT VIC AB 2-0 CT1 27 (SUTURE) ×2
SUT VIC AB 2-0 CT1 TAPERPNT 27 (SUTURE) IMPLANT
SUT VIC AB 2-0 CT2 27 (SUTURE) ×2 IMPLANT
SYR CONTROL 10ML LL (SYRINGE) ×1 IMPLANT
TOWEL OR 17X26 10 PK STRL BLUE (TOWEL DISPOSABLE) ×2 IMPLANT
TOWEL OR NON WOVEN STRL DISP B (DISPOSABLE) ×1 IMPLANT
TRAY FOLEY CATH 16FRSI W/METER (SET/KITS/TRAYS/PACK) ×1 IMPLANT
TRAY LAMINECTOMY (CUSTOM PROCEDURE TRAY) ×2 IMPLANT
YANKAUER SUCT BULB TIP NO VENT (SUCTIONS) ×1 IMPLANT

## 2014-02-19 NOTE — Discharge Instructions (Signed)
Walk As Tolerated utilizing back precautions.  No bending, twisting, or lifting.  No driving for 2 weeks.   °Aquacel dressing may remain in place for 7 days. May shower with aquacel dressing in place. After 7 days, remove aquacel dressing and place gauze and tape dressing which should be kept clean and dry and changed daily. Do not remove steri-strips if they are present. °See Dr. Beane in office in 10 to 14 days. Begin taking aspirin 81mg per day starting 4 days after your surgery if not allergic to aspirin or on another blood thinner. °Walk daily even outside. Use a cane or walker only if necessary. °Avoid sitting on soft sofas. ° °

## 2014-02-19 NOTE — Interval H&P Note (Signed)
History and Physical Interval Note:  02/19/2014 7:30 AM  Mitchell Green  has presented today for surgery, with the diagnosis of SPINAL STENOSIS  The various methods of treatment have been discussed with the patient and family. After consideration of risks, benefits and other options for treatment, the patient has consented to  Procedure(s): MICRO LUMBAR DECOMPRESSION L3-4 (N/A) as a surgical intervention .  The patient's history has been reviewed, patient examined, no change in status, stable for surgery.  I have reviewed the patient's chart and labs.  Questions were answered to the patient's satisfaction.     Tauriel Scronce C

## 2014-02-19 NOTE — Anesthesia Postprocedure Evaluation (Signed)
Anesthesia Post Note  Patient: Mitchell Green  Procedure(s) Performed: Procedure(s) (LRB): MICRO LUMBAR DECOMPRESSION L2-L3, REDO L3-4 (N/A)  Anesthesia type: General  Patient location: PACU  Post pain: Pain level controlled  Post assessment: Post-op Vital signs reviewed  Last Vitals: BP 145/77  Pulse 77  Temp(Src) 36.7 C (Oral)  Resp 14  Ht  (1.778 m)  Wt 209 lb (94.802 kg)  BMI 29.99 kg/m2  SpO2 99%  Post vital signs: Reviewed  Level of consciousness: sedated  Complications: No apparent anesthesia complications

## 2014-02-19 NOTE — H&P (View-Only) (Signed)
Mitchell Green is an 63 y.o. male.   Chief Complaint: back and bilateral leg pain HPI: The patient is a 63 year old male who presents today for follow up of their back. The patient is being followed for their back pain. They are now 1 1/2 month out from most recent flare up. Symptoms reported today include: pain. Current treatment includes: NSAIDs (Advil). The following medication has been used for pain control: Norco (prn). The patient reports their current pain level to be moderate to severe. The patient presents today following MRI.  He has pain down both legs. It is difficult to walk. He is better when he sits. No change in bowel or bladder function, fevers or chills. He is six weeks from his flare up. He is here with his wife.  No past medical history on file.  No past surgical history on file.  No family history on file. Social History:  has no tobacco, alcohol, and drug history on file.  Allergies:  Allergies  Allergen Reactions  . Procaine     Reaction unknown.      (Not in a hospital admission)  No results found for this or any previous visit (from the past 48 hour(s)). No results found.  Review of Systems  Constitutional: Negative.   HENT: Negative.   Eyes: Negative.   Respiratory: Negative.   Cardiovascular: Negative.   Gastrointestinal: Negative.   Genitourinary: Negative.   Musculoskeletal: Positive for back pain.  Skin: Negative.   Neurological: Negative.   Psychiatric/Behavioral: Negative.     There were no vitals taken for this visit. Physical Exam  Constitutional: He is oriented to person, place, and time. He appears well-developed and well-nourished.  HENT:  Head: Normocephalic and atraumatic.  Eyes: Conjunctivae and EOM are normal. Pupils are equal, round, and reactive to light.  Neck: Normal range of motion. Neck supple.  Cardiovascular: Normal rate and regular rhythm.   Respiratory: Effort normal and breath sounds normal.  GI: Soft. Bowel  sounds are normal.  Musculoskeletal:  On exam, he is upright and sitting and in mild distress. He has pain with extension and relieved with forward flexion. Lumbar spine exam reveals no evidence of soft tissue swelling, ecchymosis or deformity. On palpation there is no flank pain with percussion. Nontender over the trochanters.  Neurological: He is alert and oriented to person, place, and time. He has normal reflexes.  Skin: Skin is warm and dry.    MRI demonstrates severe multifactorial stenosis at 3-4. Laminectomy defect at 4-5. Small disc protrusion at 4-5. Recent discectomy at 5-1 and fusion. Clumping of the nerve roots at 2-3 with consistent high grade stenosis at 3-4.  Assessment/Plan Spinal stenosis Neurogenic claudication secondary to severe spinal stenosis at 3-4 adjacent to the segment of a decompression. No instability with flexion or extension. History of a fusion at 5-1.  We discussed options in extensive detail. Over 25 minutes was dedicated to this discussion. We discussed decompression at 3-4 and possibly at 2-3. If there are any changes in the symptoms in the interim he is to call. Continue on analgesics.  The patient called and wanted to proceed with surgery. I called him at home and discussed with him the symptoms. He is mainly getting symptoms down the left leg, worse when he walks, relief when he sits. He does have stenosis at 3-4, severe. Discussed decompression at 3-4 and 2-3. He would like to start the scheduling process and we will proceed accordingly.  Dr. Shelle Iron discussed risks,  complications and alternatives with the pt including but not limited to DVT, PE, infx, bleeding, failure of procedure, need for secondary procedure, dural tear, CSF leak, complex regional pain syndrome, anesthesia risk, even death. All questions were answered and he desires to proceed.   Plan microlumbar decompression L3-4, possible L2-3 and L4-5   Kathlen Sakurai M. PA-C for Dr.  Shelle Iron 02/12/2014, 7:21 AM

## 2014-02-19 NOTE — Brief Op Note (Signed)
02/19/2014  9:29 AM  PATIENT:  Mitchell Green  63 y.o. male  PRE-OPERATIVE DIAGNOSIS:  SPINAL STENOSIS  POST-OPERATIVE DIAGNOSIS:  SPINAL STENOSIS  PROCEDURE:  Procedure(s): MICRO LUMBAR DECOMPRESSION L2-L3, REDO L3-4 (N/A)  SURGEON:  Surgeon(s) and Role:    * Javier Docker, MD - Primary  PHYSICIAN ASSISTANT:   ASSISTANTS: Bissell   ANESTHESIA:   general  EBL:  Total I/O In: 2000 [I.V.:2000] Out: -   BLOOD ADMINISTERED:none  DRAINS: none   LOCAL MEDICATIONS USED:  MARCAINE     SPECIMEN:  No Specimen  DISPOSITION OF SPECIMEN:  N/A  COUNTS:  YES  TOURNIQUET:  * No tourniquets in log *  DICTATION: .Other Dictation: Dictation Number unable to hear dictated op number  PLAN OF CARE: Admit for overnight observation  PATIENT DISPOSITION:  PACU - hemodynamically stable.   Delay start of Pharmacological VTE agent (>24hrs) due to surgical blood loss or risk of bleeding: yes

## 2014-02-19 NOTE — Transfer of Care (Signed)
Immediate Anesthesia Transfer of Care Note  Patient: Mitchell Green  Procedure(s) Performed: Procedure(s): MICRO LUMBAR DECOMPRESSION L2-L3, REDO L3-4 (N/A)  Patient Location: PACU  Anesthesia Type:General  Level of Consciousness: awake, sedated and patient cooperative  Airway & Oxygen Therapy: Patient Spontanous Breathing and Patient connected to face mask oxygen  Post-op Assessment: Report given to PACU RN and Post -op Vital signs reviewed and stable  Post vital signs: Reviewed and stable  Complications: No apparent anesthesia complications

## 2014-02-20 ENCOUNTER — Encounter (HOSPITAL_COMMUNITY): Payer: Self-pay | Admitting: Specialist

## 2014-02-20 DIAGNOSIS — M48061 Spinal stenosis, lumbar region without neurogenic claudication: Secondary | ICD-10-CM | POA: Diagnosis not present

## 2014-02-20 MED ORDER — ASPIRIN EC 81 MG PO TBEC: 81.0000 mg | DELAYED_RELEASE_TABLET | Freq: Every morning | ORAL | Status: DC

## 2014-02-20 MED ORDER — PANTOPRAZOLE SODIUM 40 MG PO TBEC
40.0000 mg | DELAYED_RELEASE_TABLET | Freq: Every day | ORAL | Status: DC
  Administered 2014-02-20: 40 mg via ORAL
  Filled 2014-02-20: qty 1

## 2014-02-20 NOTE — Discharge Summary (Signed)
Physician Discharge Summary   Patient ID: Mitchell Green MRN: [id number] DOB/AGE: [birth year] 63 y.o.  Admit date: 02/19/2014 Discharge date: 02/20/2014  Primary Diagnosis:   SPINAL STENOSIS  Admission Diagnoses:  Past Medical History  Diagnosis Date  . H/O hiatal hernia   . GERD (gastroesophageal reflux disease)   . Hypertension   . Hypercholesteremia     under control  . Spinal stenosis   . Glaucoma    Discharge Diagnoses:   Principal Problem:   Spinal stenosis of lumbar region Active Problems:   Spinal stenosis  Procedure:  Procedure(s) (LRB): MICRO LUMBAR DECOMPRESSION L2-L3, REDO L3-4 (N/A)   Consults: None  HPI:  see H&P    Laboratory Data: Hospital Outpatient Visit on 02/13/2014  Component Date Value Ref Range Status  . Sodium 02/13/2014 140  137 - 147 mEq/L Final  . Potassium 02/13/2014 3.8  3.7 - 5.3 mEq/L Final  . Chloride 02/13/2014 101  96 - 112 mEq/L Final  . CO2 02/13/2014 26  19 - 32 mEq/L Final  . Glucose, Bld 02/13/2014 100* 70 - 99 mg/dL Final  . BUN 02/13/2014 18  6 - 23 mg/dL Final  . Creatinine, Ser 02/13/2014 1.23  0.50 - 1.35 mg/dL Final  . Calcium 02/13/2014 9.8  8.4 - 10.5 mg/dL Final  . GFR calc non Af Amer 02/13/2014 60* >90 mL/min Final  . GFR calc Af Amer 02/13/2014 70* >90 mL/min Final   Comment: (NOTE)                          The eGFR has been calculated using the CKD EPI equation.                          This calculation has not been validated in all clinical situations.                          eGFR's persistently <90 mL/min signify possible Chronic Kidney                          Disease.  . Anion gap 02/13/2014 13  5 - 15 Final  . WBC 02/13/2014 6.1  4.0 - 10.5 K/uL Final  . RBC 02/13/2014 5.03  4.22 - 5.81 MIL/uL Final  . Hemoglobin 02/13/2014 14.8  13.0 - 17.0 g/dL Final  . HCT 02/13/2014 42.7  39.0 - 52.0 % Final  . MCV 02/13/2014 84.9  78.0 - 100.0 fL Final  . MCH 02/13/2014 29.4  26.0 - 34.0 pg Final  . MCHC  02/13/2014 34.7  30.0 - 36.0 g/dL Final  . RDW 02/13/2014 14.9  11.5 - 15.5 % Final  . Platelets 02/13/2014 327  150 - 400 K/uL Final  . MRSA, PCR 02/13/2014 NEGATIVE  NEGATIVE Final  . Staphylococcus aureus 02/13/2014 NEGATIVE  NEGATIVE Final   Comment:                                 The Xpert SA Assay (FDA                          approved for NASAL specimens  in patients over 36 years of age),                          is one component of                          a comprehensive surveillance                          program.  Test performance has                          been validated by [employer] for patients greater                          than or equal to 69 year old.                          It is not intended                          to diagnose infection nor to                          guide or monitor treatment.   No results found for this basename: HGB,  in the last 72 hours No results found for this basename: WBC, RBC, HCT, PLT,  in the last 72 hours No results found for this basename: NA, K, CL, CO2, BUN, CREATININE, GLUCOSE, CALCIUM,  in the last 72 hours No results found for this basename: LABPT, INR,  in the last 72 hours  X-Rays:Dg Chest 2 View  02/13/2014   CLINICAL DATA:  63 year old male preoperative study for spine surgery. Hypertension. Initial encounter.  EXAM: CHEST  2 VIEW  COMPARISON:  02/17/2008.  FINDINGS: Stable lung volumes, with mild elevation of the right hemidiaphragm. Normal cardiac size and mediastinal contours. Visualized tracheal air column is within normal limits. Lung parenchyma stable and clear. No pneumothorax or effusion. No acute osseous abnormality identified.  IMPRESSION: Negative, no acute cardiopulmonary abnormality.   Electronically Signed   By: Lars Pinks M.D.   On: 02/13/2014 13:56   Dg Lumbar Spine 2-3 Views  02/13/2014   CLINICAL DATA:  63 year old male with back pain. Preoperative  study for lumbar decompression. History of spinal stenosis. Prior back surgery 2009.  EXAM: LUMBAR SPINE - 2-3 VIEW  COMPARISON:  Intraoperative portable lateral film 02/19/2008, with prior lumbar study 02/17/2008  FINDINGS: Vertebral elements maintain anatomic alignment. No acute fracture line identified.  Postoperative changes again evident of L5-S1 discectomy with interbody fusion. Unchanged position of the interbody spacers.  Facet disease present at L3-L4, L4-L5, and L5-S1 with mild associated foraminal narrowing at these levels.  No significant disc space narrowing from T10-L5.  IMPRESSION: No acute radiographic finding.  Re- demonstration of surgical changes of discectomy and fusion of L5-S1 with unchanged position of the interbody spacers.  Facet disease is most pronounced at L3-S1 with associated foraminal narrowing.  Signed,  Dulcy Fanny. Earleen Newport, DO  [facility]  [facility]   Electronically Signed   By: York Cerise  Wagner O.D.   On: 02/13/2014 13:42   Dg Spine Portable 1 View  02/19/2014   CLINICAL DATA:  L3-L4 decompression in progress  EXAM: PORTABLE SPINE - 1 VIEW  COMPARISON:  Prior lumbar spine scar except prior intraoperative radiographs obtained earlier today  FINDINGS: Single cross-table lateral spot radiographs demonstrate soft tissue spreaders posterior to the L3 spinous process. Two metallic probes are present. One probe is positioned at the L2-L3 cysts at while the more inferior probe is positioned at the L3-L4 facet. Similar appearance of a prior surgical change with interbody cages at L5-S1. Multilevel degenerative disc disease and lower lumbar facet arthropathy. Atherosclerotic calcifications in the abdominal aorta.  IMPRESSION: Intraoperative localization as above.   Electronically Signed   By: Jacqulynn Cadet M.D.   On: 02/19/2014 09:16   Dg Spine Portable 1 View  02/19/2014   CLINICAL DATA:  Lumbar decompression.  L3-4 stenosis.  EXAM:  PORTABLE SPINE - 1 VIEW  COMPARISON:  Intraoperative radiograph 02/19/2014 at 0723 hr  FINDINGS: Single portable, intraoperative, cross-table lateral radiograph of the lumbar spine is provided. Numbering continued from the prior examination. Tissue spreaders are present posteriorly. Two linear surgical instruments are present posteriorly. The tip of the more superior instrument projects posterior to the L2-3 facet joint. The tip of the more inferior instrument projects posterior to the inferior aspect of the L3-4 facet joint. L5-S1 interbody cage is again seen.  IMPRESSION: Intraoperative localization as above.   Electronically Signed   By: Logan Bores   On: 02/19/2014 08:32   Dg Spine Portable 1 View  02/19/2014   CLINICAL DATA:  Spinal stenosis.  EXAM: PORTABLE SPINE - 1 VIEW  COMPARISON:  02/13/2014  FINDINGS: There is a needle at the tip of the spinous process of L2 and a second needle at the inferior aspect of spinous process of L3.  IMPRESSION: Needles at the L2-3 and L3-4 levels.   Electronically Signed   By: Rozetta Nunnery M.D.   On: 02/19/2014 08:08    EKG: Orders placed in visit on 02/16/14  . EKG 12-LEAD     Hospital Course: Patient was admitted to [facility] and taken to the OR and underwent the above state procedure without complications.  Patient tolerated the procedure well and was later transferred to the recovery room and then to the orthopaedic floor for postoperative care.  They were given PO and IV analgesics for pain control following their surgery.  They were given 24 hours of postoperative antibiotics.   PT was consulted postop to assist with mobility and transfers.  The patient was allowed to be WBAT with therapy and was taught back precautions. Discharge planning was consulted to help with postop disposition and equipment needs.  Patient had a good night on the evening of surgery and started to get up OOB with therapy on day one. Patient was seen in rounds and was ready  to go home on day one.  They were given discharge instructions and dressing directions.  They were instructed on when to follow up in the office with Dr. Tonita Cong.   Diet: Regular diet Activity:WBAT Follow-up:in 10-14 days Disposition - Home Discharged Condition: good   Discharge Instructions   Call MD / Call 911    Complete by:  As directed   If you experience chest pain or shortness of breath, CALL 911 and be transported to the hospital emergency room.  If you develope a fever above 101 F, pus (white drainage) or increased drainage  or redness at the wound, or calf pain, call your surgeon's office.     Constipation Prevention    Complete by:  As directed   Drink plenty of fluids.  Prune juice may be helpful.  You may use a stool softener, such as Colace (over the counter) 100 mg twice a day.  Use MiraLax (over the counter) for constipation as needed.     Diet - low sodium heart healthy    Complete by:  As directed      Increase activity slowly as tolerated    Complete by:  As directed             Medication List    STOP taking these medications       HYDROcodone-acetaminophen 5-325 MG per tablet  Commonly known as:  NORCO/VICODIN      TAKE these medications       acetaminophen 500 MG tablet  Commonly known as:  TYLENOL  Take 1,000 mg by mouth every 6 (six) hours as needed for moderate pain.     aspirin EC 81 MG tablet  Take 1 tablet (81 mg total) by mouth every morning. Resume 5 days post-op     atorvastatin 40 MG tablet  Commonly known as:  LIPITOR  Take 40 mg by mouth every morning.     docusate sodium 100 MG capsule  Commonly known as:  COLACE  Take 1 capsule (100 mg total) by mouth 2 (two) times daily as needed for mild constipation.     methocarbamol 500 MG tablet  Commonly known as:  ROBAXIN  Take 1 tablet (500 mg total) by mouth every 8 (eight) hours as needed for muscle spasms.     OVER THE COUNTER MEDICATION  Take 1 packet by mouth daily. Replenex Extra  Strength.     oxyCODONE-acetaminophen 7.5-325 MG per tablet  Commonly known as:  PERCOCET  Take 1 tablet by mouth every 4 (four) hours as needed for pain.     SOOTHE XP OP  Apply 1 drop to eye as needed (two or three times daily if needed).     SYSTANE BALANCE 0.6 % Soln  Generic drug:  Propylene Glycol  Apply 1 drop to eye 3 (three) times daily as needed (dry eyes/gluacoma.).     triamterene-hydrochlorothiazide 37.5-25 MG per capsule  Commonly known as:  DYAZIDE  Take 1 capsule by mouth every morning.           Follow-up Information   Follow up with Johnn Hai, MD.   Specialty:  Orthopedic Surgery   Contact information:   [address] [place] [address] [phone]       Signed: Lacie Draft, PA-C Orthopaedic Surgery 02/20/2014, 3:04 PM

## 2014-02-20 NOTE — Evaluation (Signed)
Occupational Therapy Evaluation Patient Details Name: Mitchell Green MRN: [id number] DOB: [birth year] Today's Date: 02/20/2014    History of Present Illness This 63 y.o male admitted for MICRO LUMBAR DECOMPRESSION L2-L3, REDO L3-4    Clinical Impression   Patient evaluated by Occupational Therapy with no further acute OT needs identified. All education has been completed and the patient has no further questions. All education completed.  See below for any follow-up Occupational Therapy or equipment needs. OT is signing off. Thank you for this referral.      Follow Up Recommendations  No OT follow up;Supervision - Intermittent    Equipment Recommendations  None recommended by OT    Recommendations for Other Services       Precautions / Restrictions Precautions Precautions: Back Precaution Booklet Issued: No Precaution Comments: Pt able to state back precautions independently.        Mobility Bed Mobility                  Transfers Overall transfer level: Needs assistance Equipment used: Rolling walker (2 wheeled) Transfers: Sit to/from [employer] Sit to Stand: Supervision Stand pivot transfers: Supervision            Balance                                            ADL Overall ADL's : Needs assistance/impaired Eating/Feeding: Independent   Grooming: Wash/dry hands;Wash/dry face;Oral care;Applying deodorant;Brushing hair;Supervision/safety;Standing Grooming Details (indicate cue type and reason): reviewed safe technique for brusing teeth and shaving in standing  Upper Body Bathing: Supervision/ safety;Sitting   Lower Body Bathing: Minimal assistance;Cueing for back precautions;Sit to/from stand Lower Body Bathing Details (indicate cue type and reason): wife will assist pt at discharge as needed Upper Body Dressing : Supervision/safety;Set up;Sitting   Lower Body Dressing: Minimal assistance;Sit to/from stand;Cueing  for back precautions Lower Body Dressing Details (indicate cue type and reason): Pt instructed in safe technique.  He has min difficulty crossing ankle over knee, but is close.  His wife will assist him as needed, but encouraged him to practice every day to increase flexibility  Toilet Transfer: Supervision/safety;Ambulation;Comfort height toilet;Grab bars;RW   Toileting- Clothing Manipulation and Hygiene: Supervision/safety;Sit to/from stand       Functional mobility during ADLs: Supervision/safety General ADL Comments: Pt demonstrates good understanding of back precautions      Vision                     Perception     Praxis      Pertinent Vitals/Pain Pain Assessment: 0-10 Pain Score: 4  Pain Location: back  Pain Descriptors / Indicators: Aching Pain Intervention(s): Monitored during session     Hand Dominance Right   Extremity/Trunk Assessment Upper Extremity Assessment Upper Extremity Assessment: Overall WFL for tasks assessed   Lower Extremity Assessment Lower Extremity Assessment: Defer to PT evaluation       Communication Communication Communication: No difficulties   Cognition Arousal/Alertness: Awake/alert Behavior During Therapy: WFL for tasks assessed/performed Overall Cognitive Status: Within Functional Limits for tasks assessed                     General Comments       Exercises       Shoulder Instructions      Home Living Family/patient expects to be discharged to::  Private residence Living Arrangements: Spouse/significant other Available Help at Discharge: Family;Available 24 hours/day Type of Home: House Home Access: Stairs to enter [employer] of Steps: 4 long steps on sidewalk followed by 8 steps to porch and one step into house.   Entrance Stairs-Rails: Right;Left Home Layout: Laundry or work area in basement;Two level     Bathroom Shower/Tub: Producer, television/film/video: Handicapped  height Bathroom Accessibility: Yes How Accessible: Accessible via walker Home Equipment: Bedside commode;Shower seat;Cane - single point;Crutches;Toilet riser          Prior Functioning/Environment Level of Independence: Independent        Comments: Pt is retired.   Was independent with BADLs and IADLs    OT Diagnosis:     OT Problem List:     OT Treatment/Interventions:      OT Goals(Current goals can be found in the care plan section)    OT Frequency:     Barriers to D/C:            Co-evaluation              End of Session Equipment Utilized During Treatment: Rolling walker Nurse Communication: Mobility status  Activity Tolerance: Patient tolerated treatment well Patient left: in chair;with call bell/phone within reach;with family/visitor present   Time: 9147-8295 OT Time Calculation (min): 22 min Charges:  OT General Charges $OT Visit: 1 Procedure OT Evaluation $Initial OT Evaluation Tier I: 1 Procedure OT Treatments $Self Care/Home Management : 8-22 mins G-Codes:    Steffen Hase M 03-Mar-2014, 9:40 PM

## 2014-02-20 NOTE — Progress Notes (Addendum)
Subjective: 1 Day Post-Op Procedure(s) (LRB): MICRO LUMBAR DECOMPRESSION L2-L3, REDO L3-4 (N/A) Patient reports pain as moderate- lower back. No leg pain, numbness, tingling. Was OOB twice yesterday, soreness in back but otherwise tolerated walking well. Reports overnight he did not sleep well because of his hiatal hernia which was "jumping" and would not let him rest. It has stopped this morning.  Objective: Vital signs in last 24 hours: Temp:  [97.4 F (36.3 C)-98.1 F (36.7 C)] 97.8 F (36.6 C) (09/18 0123) Pulse Rate:  [56-84] 62 (09/18 0123) Resp:  [11-19] 16 (09/18 0621) BP: (126-157)/(63-84) 126/67 mmHg (09/18 0621) SpO2:  [90 %-100 %] 93 % (09/18 0621)  Intake/Output from previous day: 09/17 0701 - 09/18 0700 In: 3160 [P.O.:560; I.V.:2550; IV Piggyback:50] Out: 5560 [Urine:5410; Blood:150] Intake/Output this shift:    No results found for this basename: HGB,  in the last 72 hours No results found for this basename: WBC, RBC, HCT, PLT,  in the last 72 hours No results found for this basename: NA, K, CL, CO2, BUN, CREATININE, GLUCOSE, CALCIUM,  in the last 72 hours No results found for this basename: LABPT, INR,  in the last 72 hours  Neurologically intact ABD soft Neurovascular intact Sensation intact distally Intact pulses distally Dorsiflexion/Plantar flexion intact Incision: aquacel dressing saturated No cellulitis present Compartment soft no calf pain or sign of DVT  Assessment/Plan: 1 Day Post-Op Procedure(s) (LRB): MICRO LUMBAR DECOMPRESSION L2-L3, REDO L3-4 (N/A) Advance diet Up with therapy D/C IV fluids Discussed Lspine precautions, activity modifications Aquacel dressing replaced Encouraged walking program Possible D/C later today if tolerating PT well, voiding without difficulty, and pain well controlled Will discuss with Dr. Elissa Lovett, Dayna Barker. 02/20/2014, 7:36 AM

## 2014-02-20 NOTE — Progress Notes (Signed)
Clinical Social Work  CSW received a referral to assist with SNF placement. Per chart review, PT recommending no further PT follow up. CSW is signing off but available if further needs arise.  Unk Lightning, LCSW (Coverage for [employer])

## 2014-02-20 NOTE — Progress Notes (Signed)
[facility]  [facility] is providing the following services: RW  If patient discharges after hours, please call [phone].   Renard Hamper 02/20/2014, 2:55 PM

## 2014-02-20 NOTE — Evaluation (Signed)
Physical Therapy Evaluation Patient Details Name: Mitchell Green MRN: [id number] DOB: 03/20/1951 Today's Date: 02/20/2014   History of Present Illness  This 63 y.o male admitted for MICRO LUMBAR DECOMPRESSION L2-L3, REDO L3-4   Clinical Impression  Pt will benefit from additional PT session today for further gait and stair training, reinforce back precautions and safety;     Follow Up Recommendations No PT follow up    Equipment Recommendations  Rolling walker with 5" wheels    Recommendations for Other Services       Precautions / Restrictions Precautions Precautions: Back Precaution Comments: issued handout on back precautions; pt able to verbalize 2/3  Restrictions Weight Bearing Restrictions: No      Mobility  Bed Mobility               General bed mobility comments: pt and wife te=ogether verbalize correct technique  Transfers Overall transfer level: Needs assistance Equipment used: Rolling walker (2 wheeled) Transfers: Sit to/from Stand           General transfer comment: pt self corrects hand placement, incr time  Ambulation/Gait Ambulation/Gait assistance: Supervision;Min guard Ambulation Distance (Feet): 300 Feet Assistive device: Rolling walker (2 wheeled) Gait Pattern/deviations: Step-through pattern;Decreased stride length     General Gait Details: cues for back precautions during turns  Careers information officer    Modified Rankin (Stroke Patients Only)       Balance Overall balance assessment: No apparent balance deficits (not formally assessed)                                           Pertinent Vitals/Pain Pain Assessment: 0-10 Pain Score: 2  Pain Location: back Pain Descriptors / Indicators: Sore Pain Intervention(s): Monitored during session;Repositioned    Home Living Family/patient expects to be discharged to:: Private residence Living Arrangements: Spouse/significant  other Available Help at Discharge: Family;Available 24 hours/day Type of Home: House Home Access: Stairs to enter Entrance Stairs-Rails: Right;Left Entrance Stairs-Number of Steps: 4 long steps on sidewalk followed by 8 steps to porch and one step into house.   Home Layout: Laundry or work area in basement;Two level Home Equipment: Bedside commode;Shower seat;Cane - single point;Crutches;Toilet riser      Prior Function Level of Independence: Independent         Comments: Pt is retired.   Was independent with BADLs and IADLs     Hand Dominance   Dominant Hand: Right    Extremity/Trunk Assessment   Upper Extremity Assessment: Defer to OT evaluation           Lower Extremity Assessment: Overall WFL for tasks assessed         Communication   Communication: No difficulties  Cognition Arousal/Alertness: Awake/alert Behavior During Therapy: WFL for tasks assessed/performed Overall Cognitive Status: Within Functional Limits for tasks assessed                      General Comments      Exercises        Assessment/Plan    PT Assessment Patient needs continued PT services  PT Diagnosis Difficulty walking   PT Problem List Decreased knowledge of use of DME;Decreased knowledge of precautions;Decreased mobility  PT Treatment Interventions Gait training;Stair training;Patient/family education   PT Goals (Current goals can be found in the  Care Plan section) Acute Rehab PT Goals Patient Stated Goal: home, decr back pain PT Goal Formulation: With patient Time For Goal Achievement: 02/20/14 Potential to Achieve Goals: Good    Frequency Min 1X/week (one more session prior to D/C)   Barriers to discharge        Co-evaluation               End of Session   Activity Tolerance: Patient tolerated treatment well Patient left: in chair;with call bell/phone within reach;with family/visitor present      Functional Assessment Tool Used: clinical  judgement Functional Limitation: Mobility: Walking and moving around Mobility: Walking and Moving Around Current Status (Z6109): At least 1 percent but less than 20 percent impaired, limited or restricted Mobility: Walking and Moving Around Goal Status [phone]): At least 1 percent but less than 20 percent impaired, limited or restricted    Time: 0981-1914 PT Time Calculation (min): 32 min   Charges:   PT Evaluation $Initial PT Evaluation Tier I: 1 Procedure PT Treatments $Gait Training: 23-37 mins   PT G Codes:   Functional Assessment Tool Used: clinical judgement Functional Limitation: Mobility: Walking and moving around    [facility] 02/20/2014, 11:30 AM

## 2014-02-20 NOTE — Progress Notes (Signed)
RN reviewed discharge instructions with patient and family. All questions answered.  Paperwork and prescriptions given.   NT rolled patient down in wheelchair to family car.  

## 2014-02-20 NOTE — Op Note (Signed)
NAME:  Runde, WILLIKIAAN, [id number]  MEDICAL RECORD NO.:  [id number]  LOCATION:  [facility]                         FACILITY:  [facility]  PHYSICIAN:  Jene Every, M.D.    DATE OF BIRTH:  [birth year]  DATE OF PROCEDURE:  02/19/2014 DATE OF DISCHARGE:                              OPERATIVE REPORT   PREOPERATIVE DIAGNOSIS:  Spinal stenosis, recurrent at L3-4 and spinal stenosis 2-3.  POSTOPERATIVE DIAGNOSIS:  Spinal stenosis, recurrent at L3-4 and spinal stenosis 2-3.  PROCEDURE PERFORMED:  Redo decompression at L3-4 with bilateral foraminotomies of L4 and L3, two central laminectomy of L3 with decompression at 2-3 and bilateral foraminotomies of L3.  ANESTHESIA:  General.  ASSISTANT:  Lanna Poche, PA.  BRIEF HISTORY:  This is a 63 year old who has had history of lumbar decompression and fusion at 5-1, decompression 4-5, and removal of the spinous process of 4.  He had residual ligamentum flavum, epidural fibrosis at 3-4, generating severe spinal stenosis centrally.  The patient had claudicating symptoms, left greater than right.  He also has L4 stenosis noted and possible 4-5 stenosis.  We discussed failing conservative treatment, decompressing at 3-4, possible removal of the neural arch and decompressing at 2-3, possibly at 4-5.  Risk and benefits were discussed including bleeding, infection, damage to neurovascular structures, DVT, PE, anesthetic complications, etc.  TECHNIQUE:  The patient in supine position, after induction of adequate general anesthesia, 2 g Kefzol, placed prone on Andrews frame.  All bony prominences were well padded.  Lumbar region was prepped and draped in usual sterile fashion.  Two 18-gauge spinal needle was utilized to localize the L3-4 interspace, confirmed with x-ray.  Incision was made from the spinous process to the 4-5 space excising previous scar, subcutaneous tissue was dissected.  Electrocautery was utilized to achieve  hemostasis.  Dorsolumbar fascia identified and divided in line with the skin incision.  Paraspinous muscles elevated from lamina 2-3 and 3-4.  McCullough retractor was placed.  Confirmatory radiograph obtained.  Operating microscope was draped and brought into the surgical field.  A Leksell was utilized to remove the spinous process of 3. There was extensive epidural fibrosis and muscular fibrosis at 3-4. This was meticulously dissected, and we placed self-retaining retractors.  Hemilaminotomy at the residual edge of 3 was performed with 2 mm Kerrison, removing the full neural arch, there was a severe stenosis noted centrally.  I removed the central portion of the neural arch, then I removed the left and right remaining __________ of the neural arch, I decompressed the lateral recesses of the medial border of the pedicle performing partial medial hemifacetectomy, less than 20% of the facet was excised.  Removed ligamentum flavum from 2-3.  There was significant fibrosis noted at 3-4.  Therefore, mobilizing the thecal sac at 2-3 and the cephalad edge of 3-4, I performed foraminotomies of 3. Used micro and straight curette to mobilize and develop a plane between the facets and the previous laminotomy defect.  We protected the thecal sac, performed foraminotomies of 3 and 4 bilaterally, was tighter on left than on right.  No disk herniation was noted.  Bipolar electrocautery was utilized to achieve hemostasis.  He had  severe residual stenosis at 3-4 with ligamentum flavum hypertrophy and epidural fibrosis.  This was meticulously decompressed and care was taken not to create a dural rent.  We continued our decompression caudad. Significant amount of epidural fibrosis was noted, however, caudal beginning into the 4-5 space.  I did not feel due to the significant number of adhesions there, that the risk of inadvertent durotomy would be excessive, continuing cephalad to evaluate the 4-5 area.   There was a possible component of lateral recess stenosis at 4-5.  I did not feel that was clinically evident, however.  Mobilized the thecal sac.  There was good restoration of thecal sac.  No evidence of CSF leakage or active bleeding.  A neuroprobe passed freely up the foramen of 3 and 4. Bipolar electrocautery was utilized to achieve hemostasis.  We placed bone wax on the cancellous surfaces.  Copiously irrigated the wound. Placed thrombin-soaked Gelfoam in the laminotomy defect.  Removed the McCullough retractor and irrigated the paraspinous musculature, closed the dorsolumbar fascia with 1 Vicryl, subcu with 2, and skin with staples.  Wound was dressed sterilely.  Placed supine on the hospital bed, extubated without difficulty, and transported to the recovery room in satisfactory condition.  The patient tolerated the procedure well.  No complications.  Assistant, Lanna Poche, PA, was used for fascial closure, gentle intermittent nerve retraction, position, etc.  ESTIMATED BLOOD LOSS:  50 mL.     Jene Every, M.D.     Cordelia Pen  D:  02/19/2014  T:  02/19/2014  Job:  [id number]

## 2014-02-20 NOTE — Progress Notes (Signed)
02/20/14 1300  PT Visit Information  Last PT Received On 02/20/14  Assistance Needed +1  History of Present Illness This 63 y.o male admitted for MICRO LUMBAR DECOMPRESSION L2-L3, REDO L3-4  PT Time Calculation  PT Start Time 1248  PT Stop Time 1310  PT Time Calculation (min) 22 min  Subjective Data  Patient Stated Goal home, decr back pain  Precautions  Precautions Back  Precaution Comments issued handout on back precautions; pt able to verbalize 3/3   Pain Assessment  Pain Assessment 0-10  Pain Location back  Pain Descriptors / Indicators Sore  Pain Intervention(s) Monitored during session;Repositioned;Patient requesting pain meds-RN notified  Cognition  Arousal/Alertness Awake/alert  Behavior During Therapy WFL for tasks assessed/performed  Overall Cognitive Status Within Functional Limits for tasks assessed  Bed Mobility  Overal bed mobility Needs Assistance  Bed Mobility Sit to Sidelying  Sit to sidelying Supervision  General bed mobility comments VCs for back precauitons,  log roll  Transfers  Overall transfer level Modified independent  Equipment used Rolling walker (2 wheeled)  Transfers Sit to/from Stand  Sit to Stand Modified independent (Device/Increase time)  General transfer comment demo's good technique  Ambulation/Gait  Ambulation/Gait assistance Modified independent (Device/Increase time)  Ambulation Distance (Feet) 240 Feet  Assistive device Rolling walker (2 wheeled)  Gait Pattern/deviations Step-through pattern  General Gait Details demo's safe technique during turns, RW position and posture  Stairs Yes  Stairs assistance Modified independent (Device/Increase time)  Stair Management One rail Right;Step to pattern;Forwards;With cane  Number of Stairs 4  General stair comments cues for sequence  PT - End of Session  Activity Tolerance Patient tolerated treatment well  Patient left in bed;with call bell/phone within reach;with family/visitor present   Nurse Communication Mobility status  PT - Assessment/Plan  PT Plan Current plan remains appropriate  Follow Up Recommendations No PT follow up  PT equipment Rolling walker with 5" wheels  PT Goal Progression  Progress towards PT goals Progressing toward goals  Acute Rehab PT Goals  PT Goal Formulation With patient  Time For Goal Achievement 02/20/14  Potential to Achieve Goals Good  PT G-Codes **NOT FOR INPATIENT CLASS**  Functional Assessment Tool Used clinical judgement  Functional Limitation Mobility: Walking and moving around  Mobility: Walking and Moving Around Goal Status [phone]) CI  Mobility: Walking and Moving Around Discharge Status (Q4696) [facility]  PT General Charges  $$ ACUTE PT VISIT 1 Procedure  PT Treatments  $Gait Training 8-22 mins

## 2014-02-20 NOTE — Care Management Note (Unsigned)
    Page 1 of 1   02/20/2014     1:28:20 PM CARE MANAGEMENT NOTE 02/20/2014  Patient:  Mitchell Green, Mitchell Green   Account Number:  [id number]  Date Initiated:  02/20/2014  Documentation initiated by:  [facility]  Subjective/Objective Assessment:   63 year old male admitted s/p Redo decompression at L3-4 with bilateral  foraminotomies of L4 and L3, two central laminectomy of L3 with  decompression at 2-3 and bilateral foraminotomies of L3.     Action/Plan:   From home and will return there at d/c.   Anticipated DC Date:  02/20/2014   Anticipated DC Plan:  HOME/SELF CARE      DC Planning Services  CM consult      Choice offered to / List presented to:     DME arranged  Levan Hurst      DME agency  [facility]        Status of service:  In process, will continue to follow Medicare Important Message given?   (If response is "NO", the following Medicare IM given date fields will be blank) Date Medicare IM given:   Medicare IM given by:   Date Additional Medicare IM given:   Additional Medicare IM given by:    Discharge Disposition:    Per UR Regulation:  Reviewed for med. necessity/level of care/duration of stay  If discussed at Long Length of Stay Meetings, dates discussed:    Comments:

## 2014-02-23 NOTE — Progress Notes (Addendum)
Occupational Therapy Evaluation Addendum:   02-25-14 2100  OT G-codes **NOT FOR INPATIENT CLASS**  Functional Limitation Self care  Self Care Current Status [phone]) CI  Self Care Discharge Status [phone]) CI   Jeani Hawking, OTR/L [phone]

## 2015-08-25 IMAGING — CR DG CHEST 2V
2 series · 2 of 2 positions shown · non-contrast
Comparison: 02/17/2008.

CLINICAL DATA: 63-year-old male preoperative study for spine
surgery. Hypertension. Initial encounter.

EXAM:
CHEST  2 VIEW

[w chest pa]
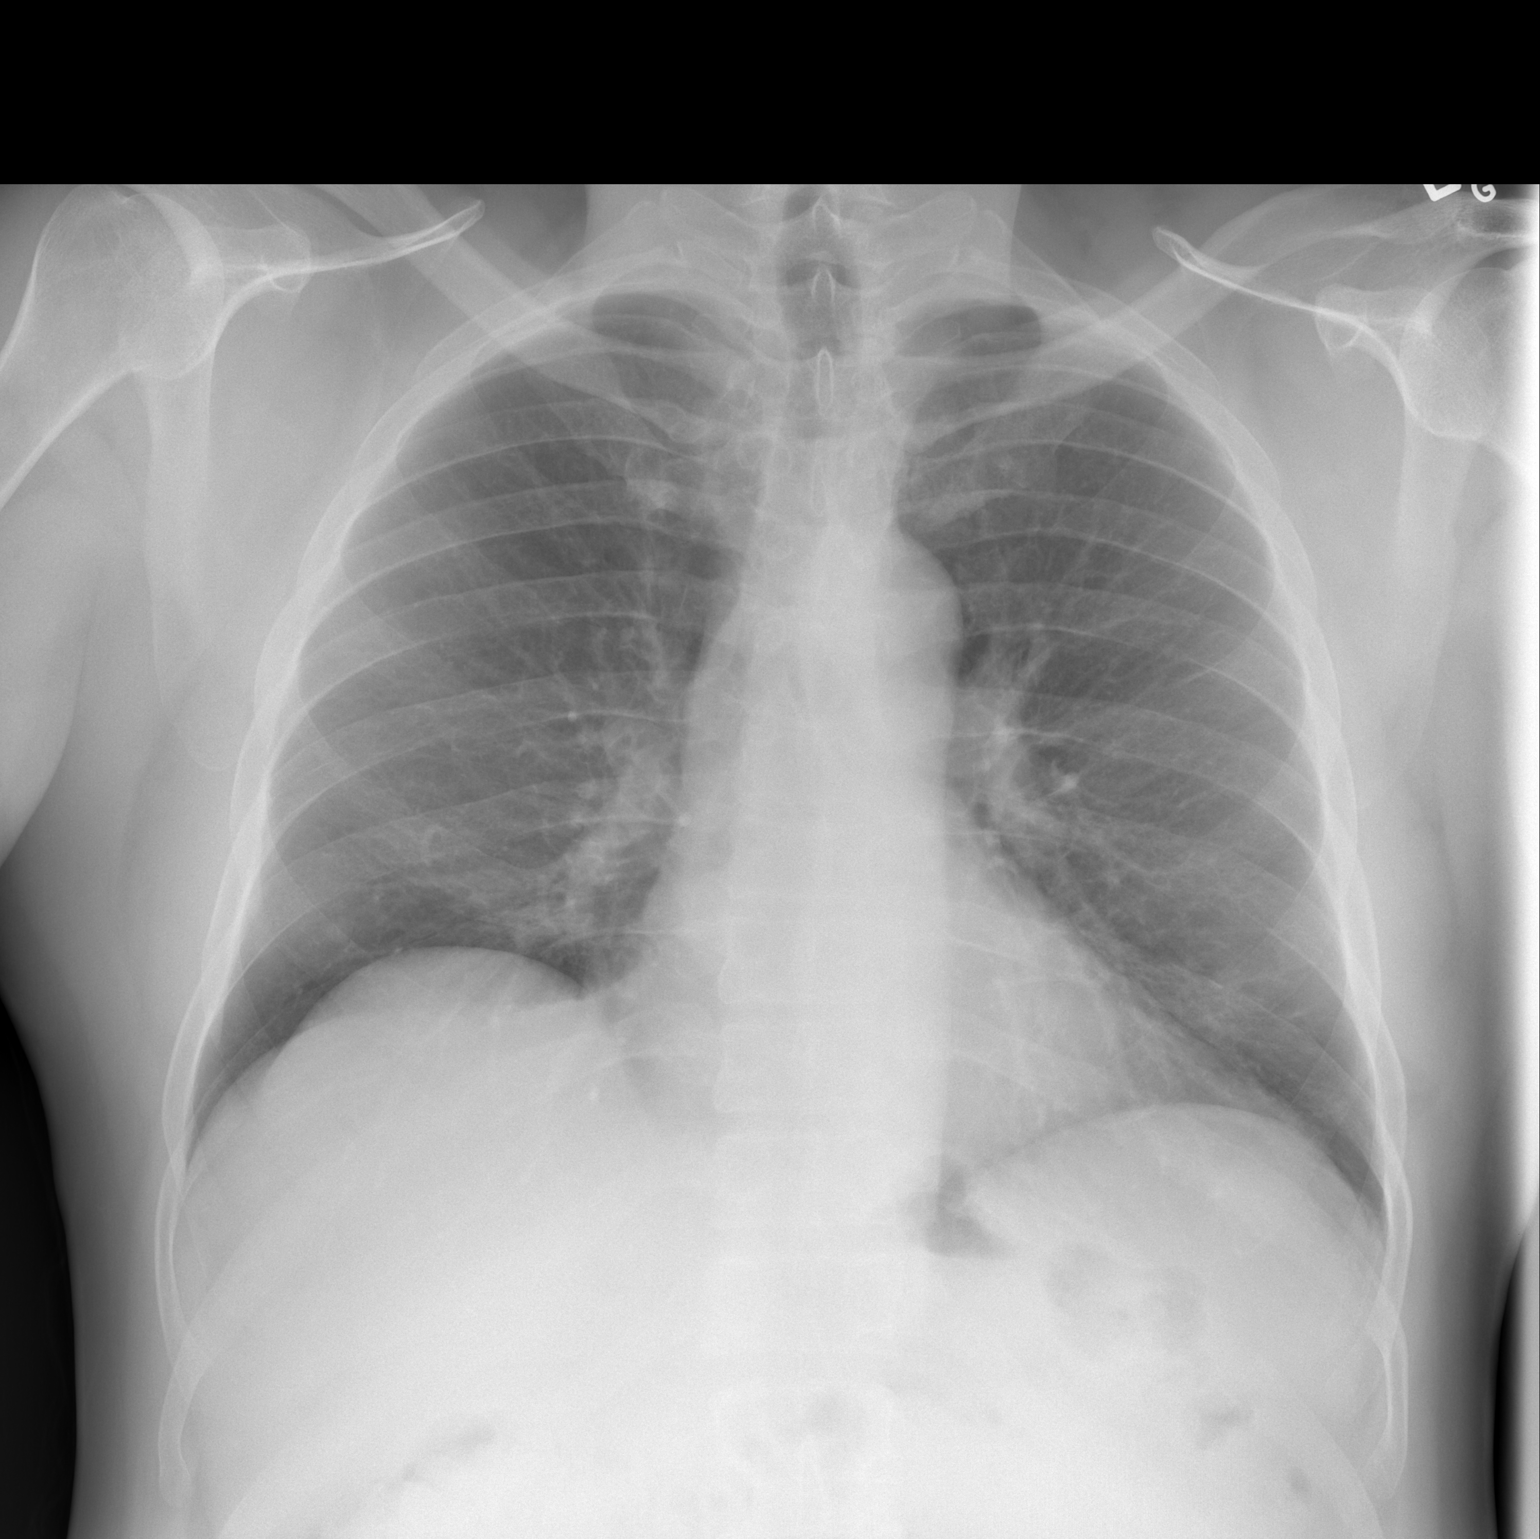

[w chest lat]
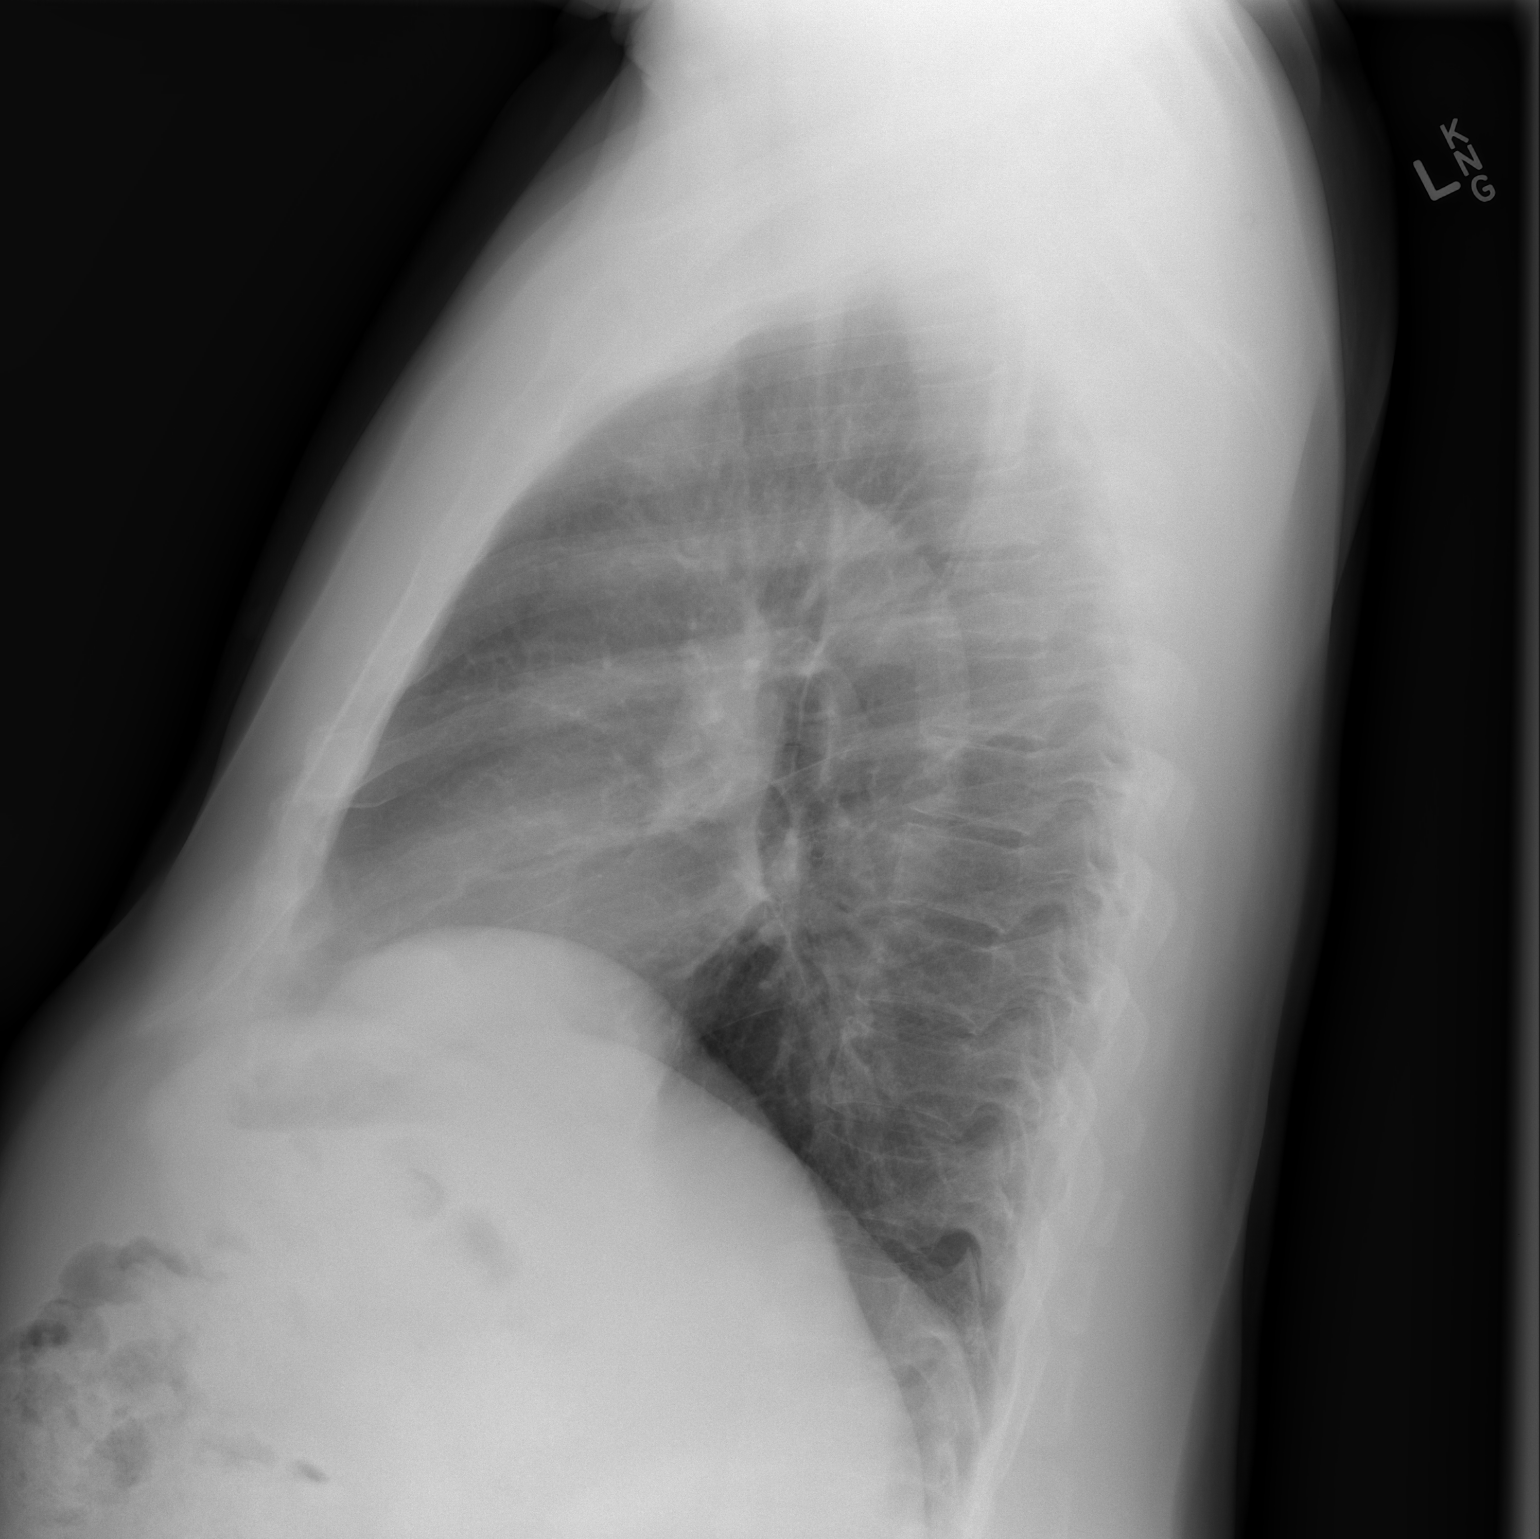

[2 of 2 positions shown; findings below may reference images not displayed]

FINDINGS: Stable lung volumes, with mild elevation of the right hemidiaphragm.
Normal cardiac size and mediastinal contours. Visualized tracheal
air column is within normal limits. Lung parenchyma stable and
clear. No pneumothorax or effusion. No acute osseous abnormality
identified.
IMPRESSION: Negative, no acute cardiopulmonary abnormality.

## 2015-08-31 IMAGING — DX DG SPINE 1V PORT
1 series · 1 of 1 positions shown · non-contrast
Comparison: Prior lumbar spine scar except prior intraoperative
radiographs obtained earlier today

CLINICAL DATA: L3-L4 decompression in progress

EXAM:
PORTABLE SPINE - 1 VIEW

[l-spine x-table]
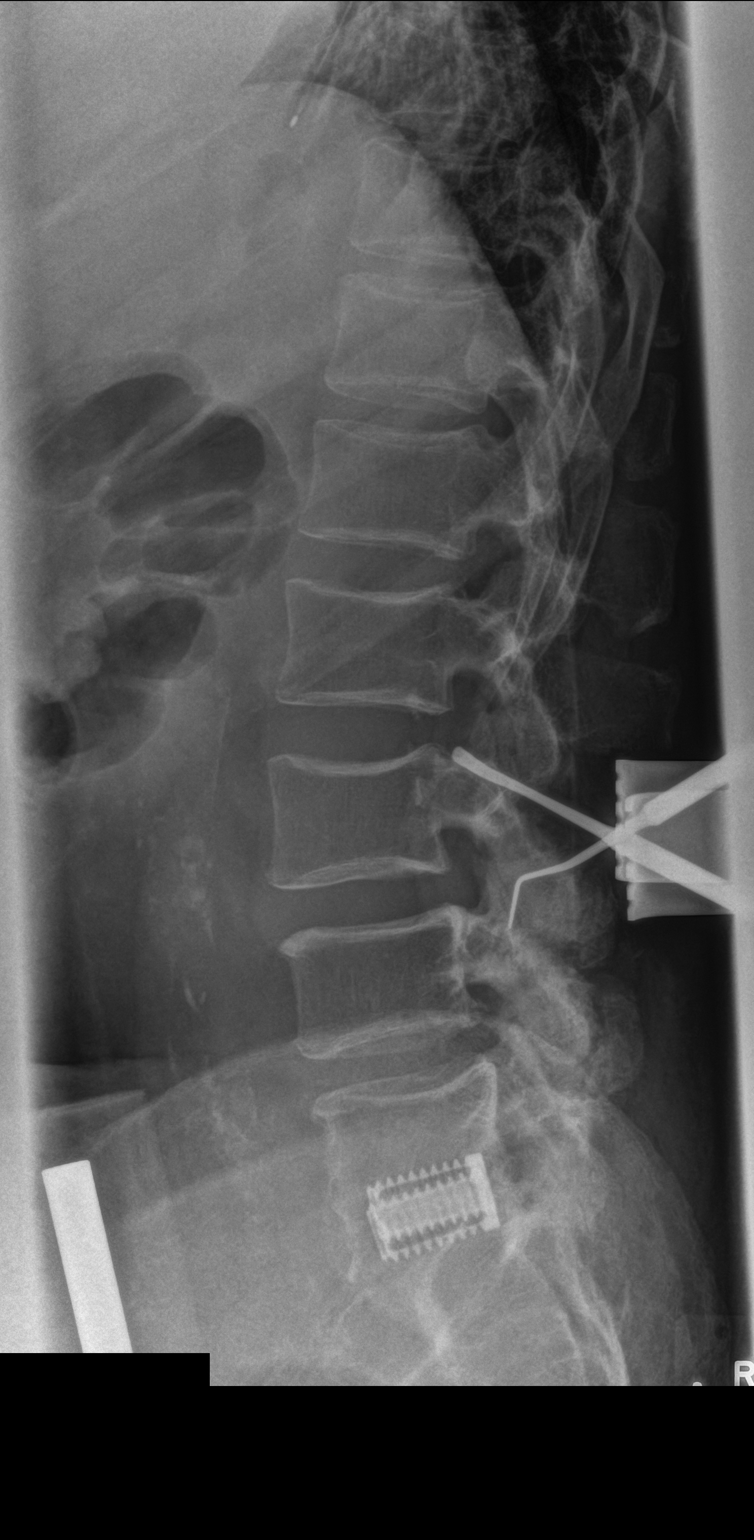

[1 of 1 positions shown; findings below may reference images not displayed]

FINDINGS: Single cross-table lateral spot radiographs demonstrate soft tissue
spreaders posterior to the L3 spinous process. Two metallic probes
are present. One probe is positioned at the L2-L3 cysts at while the
more inferior probe is positioned at the L3-L4 facet. Similar
appearance of a prior surgical change with interbody cages at L5-S1.
Multilevel degenerative disc disease and lower lumbar facet
arthropathy. Atherosclerotic calcifications in the abdominal aorta.
IMPRESSION: Intraoperative localization as above.

## 2015-08-31 IMAGING — DX DG SPINE 1V PORT
1 series · 1 of 1 positions shown · non-contrast
Comparison: 02/13/2014

CLINICAL DATA: Spinal stenosis.

EXAM:
PORTABLE SPINE - 1 VIEW

[l-spine x-table]
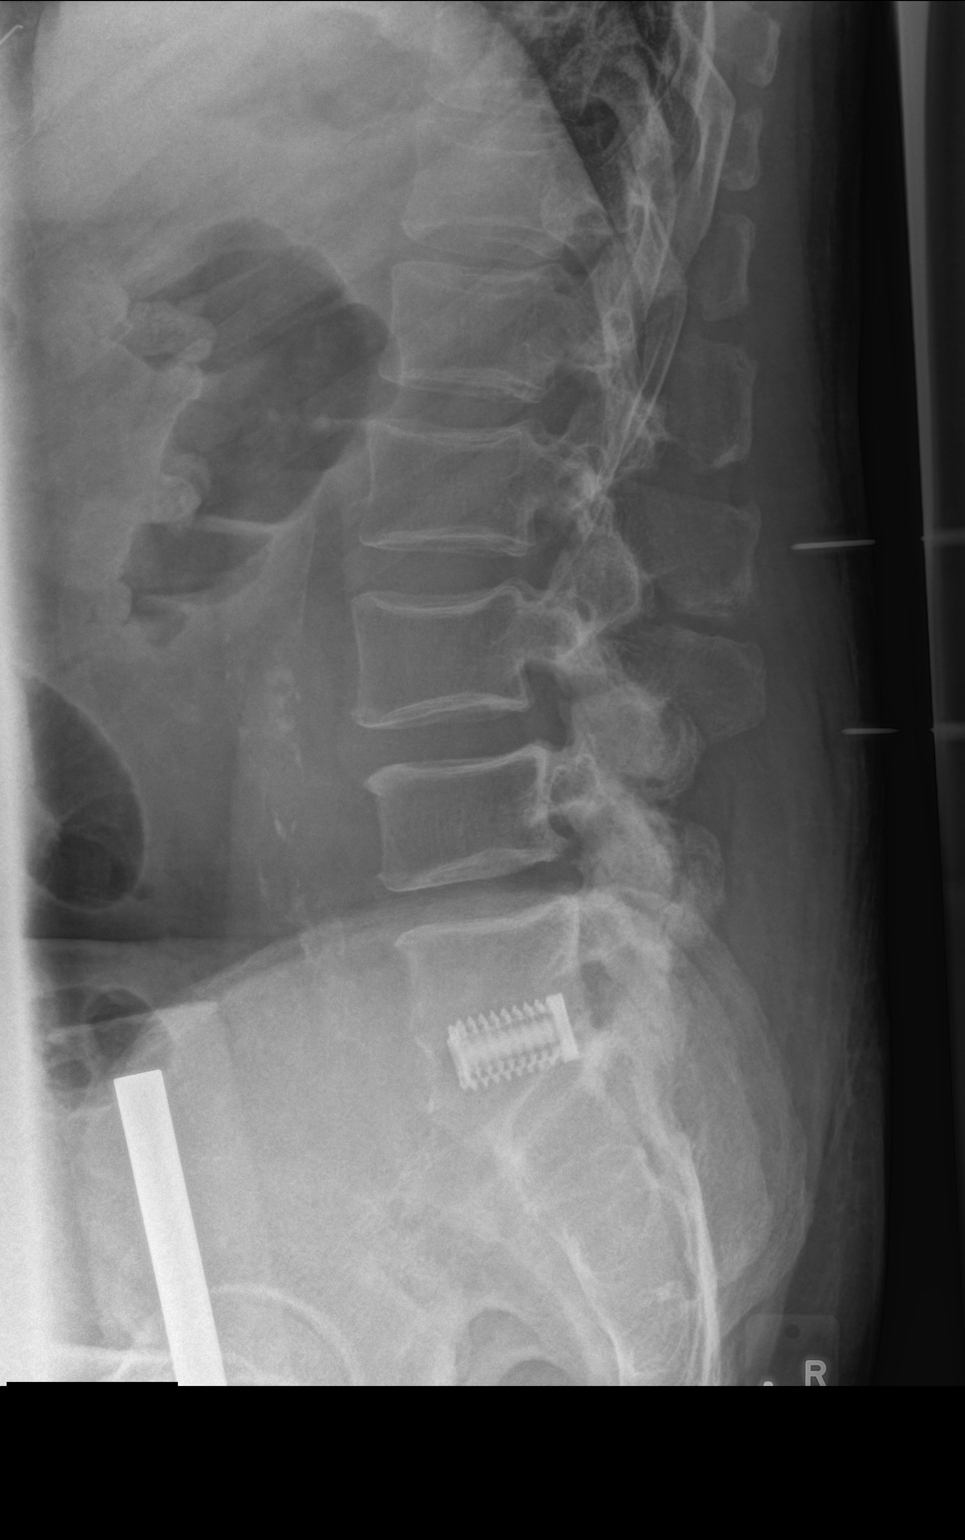

[1 of 1 positions shown; findings below may reference images not displayed]

FINDINGS: There is a needle at the tip of the spinous process of L2 and a
second needle at the inferior aspect of spinous process of L3.
IMPRESSION: Needles at the L2-3 and L3-4 levels.

## 2015-08-31 IMAGING — DX DG SPINE 1V PORT
1 series · 1 of 1 positions shown · non-contrast
Comparison: Intraoperative radiograph 02/19/2014 at 9217 hr

CLINICAL DATA: Lumbar decompression.  L3-4 stenosis.

EXAM:
PORTABLE SPINE - 1 VIEW

[l-spine x-table]
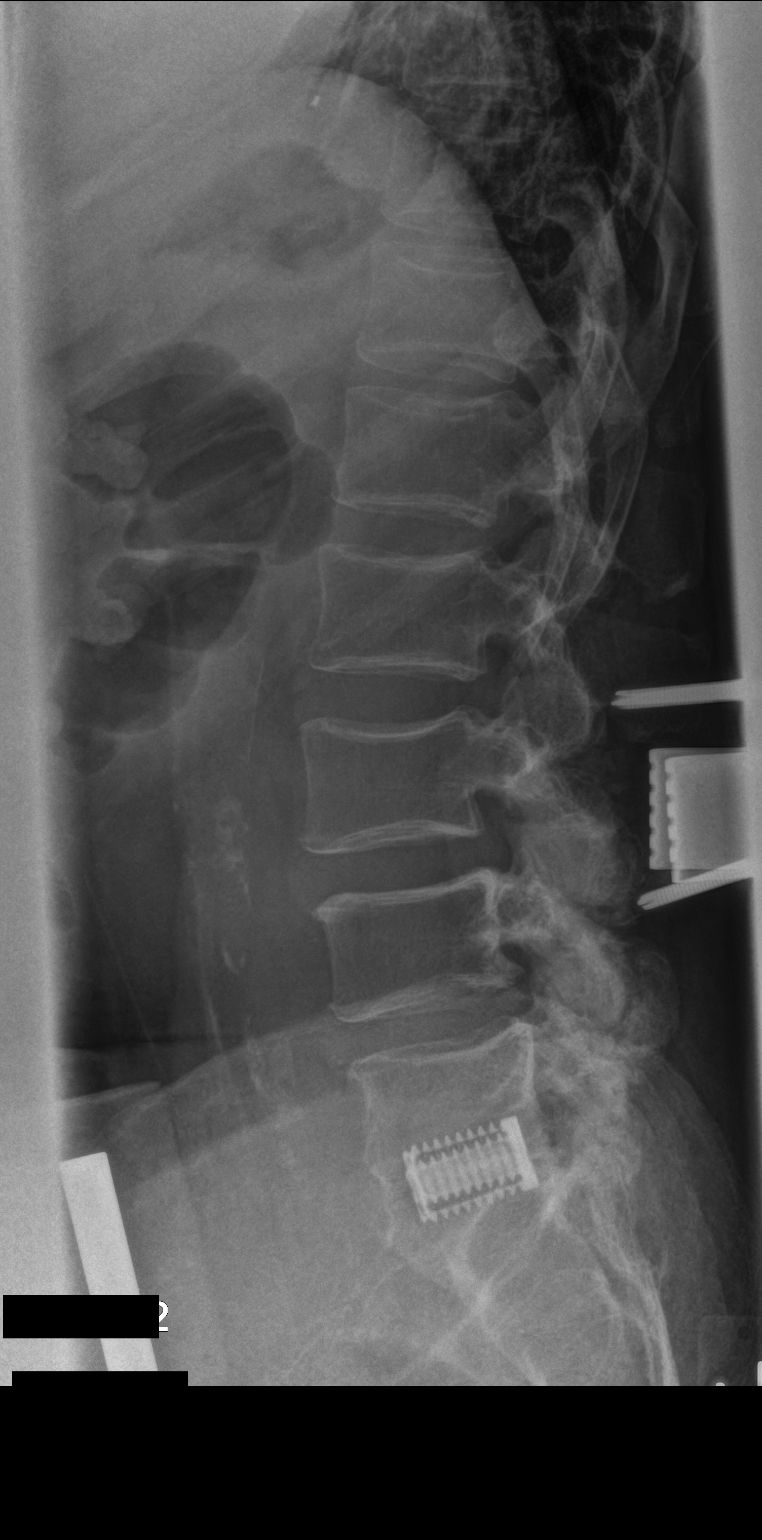

[1 of 1 positions shown; findings below may reference images not displayed]

FINDINGS: Single portable, intraoperative, cross-table lateral radiograph of
the lumbar spine is provided. Numbering continued from the prior
examination. Tissue spreaders are present posteriorly. Two linear
surgical instruments are present posteriorly. The tip of the more
superior instrument projects posterior to the L2-3 facet joint. The
tip of the more inferior instrument projects posterior to the
inferior aspect of the L3-4 facet joint. L5-S1 interbody cage is
again seen.
IMPRESSION: Intraoperative localization as above.

## 2020-08-27 ENCOUNTER — Other Ambulatory Visit: Payer: Self-pay | Admitting: Neurological Surgery

## 2020-09-03 ENCOUNTER — Other Ambulatory Visit (HOSPITAL_COMMUNITY)
Admission: RE | Admit: 2020-09-03 | Discharge: 2020-09-03 | Disposition: A | Discharge status: Home / Self Care | Payer: Medicare Other | Source: Ambulatory Visit | Attending: Neurological Surgery | Admitting: Neurological Surgery

## 2020-09-03 DIAGNOSIS — Z01812 Encounter for preprocedural laboratory examination: Secondary | ICD-10-CM | POA: Diagnosis present

## 2020-09-03 DIAGNOSIS — Z20822 Contact with and (suspected) exposure to covid-19: Secondary | ICD-10-CM | POA: Diagnosis not present

## 2020-09-04 LAB — SARS CORONAVIRUS 2 (TAT 6-24 HRS): SARS Coronavirus 2: NEGATIVE

## 2020-09-06 ENCOUNTER — Encounter (HOSPITAL_COMMUNITY): Payer: Self-pay | Admitting: Neurological Surgery

## 2020-09-06 ENCOUNTER — Other Ambulatory Visit: Payer: Self-pay

## 2020-09-06 NOTE — Progress Notes (Signed)
Mitchell Green denies chest pain or shortness of breath. Patient was tested for Covid and has been in quarantine since that time.

## 2020-09-07 ENCOUNTER — Other Ambulatory Visit: Payer: Self-pay

## 2020-09-07 ENCOUNTER — Inpatient Hospital Stay (HOSPITAL_COMMUNITY): Payer: Medicare Other | Admitting: Anesthesiology

## 2020-09-07 ENCOUNTER — Encounter (HOSPITAL_COMMUNITY)
Admission: RE | Disposition: A | Discharge status: Home / Self Care | Payer: Self-pay | Source: Home / Self Care | Attending: Neurological Surgery

## 2020-09-07 ENCOUNTER — Encounter (HOSPITAL_COMMUNITY): Payer: Self-pay | Admitting: Neurological Surgery

## 2020-09-07 ENCOUNTER — Inpatient Hospital Stay (HOSPITAL_COMMUNITY): Payer: Medicare Other

## 2020-09-07 ENCOUNTER — Inpatient Hospital Stay (HOSPITAL_COMMUNITY)
Admission: RE | Admit: 2020-09-07 | Discharge: 2020-09-09 | DRG: 455 | Disposition: A | Discharge status: Home / Self Care | Payer: Medicare Other | Attending: Neurological Surgery | Admitting: Neurological Surgery

## 2020-09-07 DIAGNOSIS — Z87891 Personal history of nicotine dependence: Secondary | ICD-10-CM

## 2020-09-07 DIAGNOSIS — R066 Hiccough: Secondary | ICD-10-CM | POA: Diagnosis present

## 2020-09-07 DIAGNOSIS — M48062 Spinal stenosis, lumbar region with neurogenic claudication: Secondary | ICD-10-CM | POA: Diagnosis present

## 2020-09-07 DIAGNOSIS — Z8249 Family history of ischemic heart disease and other diseases of the circulatory system: Secondary | ICD-10-CM

## 2020-09-07 DIAGNOSIS — M1990 Unspecified osteoarthritis, unspecified site: Secondary | ICD-10-CM | POA: Diagnosis present

## 2020-09-07 DIAGNOSIS — I10 Essential (primary) hypertension: Secondary | ICD-10-CM | POA: Diagnosis present

## 2020-09-07 DIAGNOSIS — L905 Scar conditions and fibrosis of skin: Secondary | ICD-10-CM | POA: Diagnosis present

## 2020-09-07 DIAGNOSIS — E7800 Pure hypercholesterolemia, unspecified: Secondary | ICD-10-CM | POA: Diagnosis present

## 2020-09-07 DIAGNOSIS — H409 Unspecified glaucoma: Secondary | ICD-10-CM | POA: Diagnosis present

## 2020-09-07 DIAGNOSIS — M4316 Spondylolisthesis, lumbar region: Secondary | ICD-10-CM | POA: Diagnosis present

## 2020-09-07 DIAGNOSIS — Z419 Encounter for procedure for purposes other than remedying health state, unspecified: Secondary | ICD-10-CM

## 2020-09-07 HISTORY — PX: TRANSFORAMINAL LUMBAR INTERBODY FUSION (TLIF) WITH PEDICLE SCREW FIXATION 2 LEVEL: SHX6142

## 2020-09-07 HISTORY — DX: Unspecified osteoarthritis, unspecified site: M19.90

## 2020-09-07 LAB — CBC
HCT: 44.5 % (ref 39.0–52.0)
Hemoglobin: 14.7 g/dL (ref 13.0–17.0)
MCH: 28.7 pg (ref 26.0–34.0)
MCHC: 33 g/dL (ref 30.0–36.0)
MCV: 86.7 fL (ref 80.0–100.0)
Platelets: 315 10*3/uL (ref 150–400)
RBC: 5.13 MIL/uL (ref 4.22–5.81)
RDW: 14.3 % (ref 11.5–15.5)
WBC: 6.4 10*3/uL (ref 4.0–10.5)
nRBC: 0 % (ref 0.0–0.2)

## 2020-09-07 LAB — GLUCOSE, CAPILLARY: Glucose-Capillary: 102 mg/dL — ABNORMAL HIGH (ref 70–99)

## 2020-09-07 LAB — TYPE AND SCREEN
ABO/RH(D): A POS
Antibody Screen: NEGATIVE

## 2020-09-07 LAB — BASIC METABOLIC PANEL
Anion gap: 7 (ref 5–15)
BUN: 12 mg/dL (ref 8–23)
CO2: 26 mmol/L (ref 22–32)
Calcium: 9.4 mg/dL (ref 8.9–10.3)
Chloride: 106 mmol/L (ref 98–111)
Creatinine, Ser: 1.23 mg/dL (ref 0.61–1.24)
GFR, Estimated: >60 mL/min (ref >60)
Glucose, Bld: 122 mg/dL — ABNORMAL HIGH (ref 70–99)
Potassium: 3.4 mmol/L — ABNORMAL LOW (ref 3.5–5.1)
Sodium: 139 mmol/L (ref 135–145)

## 2020-09-07 LAB — ABO/RH: ABO/RH(D): A POS

## 2020-09-07 LAB — SURGICAL PCR SCREEN
MRSA, PCR: NEGATIVE
Staphylococcus aureus: NEGATIVE

## 2020-09-07 SURGERY — TRANSFORAMINAL LUMBAR INTERBODY FUSION (TLIF) WITH PEDICLE SCREW FIXATION 2 LEVEL
Anesthesia: General | Site: Spine Lumbar

## 2020-09-07 MED ORDER — FENTANYL CITRATE (PF) 100 MCG/2ML IJ SOLN
25.0000 ug | INTRAMUSCULAR | Status: DC | PRN
  Administered 2020-09-07: 50 ug via INTRAVENOUS

## 2020-09-07 MED ORDER — ONDANSETRON HCL 4 MG/2ML IJ SOLN
INTRAMUSCULAR | Status: DC | PRN
  Administered 2020-09-07: 4 mg via INTRAVENOUS

## 2020-09-07 MED ORDER — ONDANSETRON HCL 4 MG/2ML IJ SOLN: 4.0000 mg | Freq: Four times a day (QID) | INTRAMUSCULAR | Status: DC | PRN

## 2020-09-07 MED ORDER — ONDANSETRON HCL 4 MG/2ML IJ SOLN
INTRAMUSCULAR | Status: AC
  Filled 2020-09-07: qty 2

## 2020-09-07 MED ORDER — CHLORHEXIDINE GLUCONATE CLOTH 2 % EX PADS: 6.0000 | MEDICATED_PAD | Freq: Once | CUTANEOUS | Status: DC

## 2020-09-07 MED ORDER — FENTANYL CITRATE (PF) 250 MCG/5ML IJ SOLN
INTRAMUSCULAR | Status: AC
  Filled 2020-09-07: qty 5

## 2020-09-07 MED ORDER — ACETAMINOPHEN 10 MG/ML IV SOLN: 1000.0000 mg | Freq: Once | INTRAVENOUS | Status: DC | PRN

## 2020-09-07 MED ORDER — ALBUMIN HUMAN 5 % IV SOLN: INTRAVENOUS | Status: DC | PRN

## 2020-09-07 MED ORDER — PROPYLENE GLYCOL 0.6 % OP SOLN: 1.0000 [drp] | Freq: Three times a day (TID) | OPHTHALMIC | Status: DC | PRN

## 2020-09-07 MED ORDER — PROMETHAZINE HCL 25 MG/ML IJ SOLN
6.2500 mg | INTRAMUSCULAR | Status: DC | PRN
Start: 2020-09-07 — End: 2020-09-07

## 2020-09-07 MED ORDER — POLYETHYLENE GLYCOL 3350 17 G PO PACK
17.0000 g | PACK | Freq: Every day | ORAL | Status: DC | PRN
  Administered 2020-09-09: 17 g via ORAL
  Filled 2020-09-07: qty 1

## 2020-09-07 MED ORDER — EPHEDRINE SULFATE-NACL 50-0.9 MG/10ML-% IV SOSY
PREFILLED_SYRINGE | INTRAVENOUS | Status: DC | PRN
  Administered 2020-09-07 (×2): 5 mg via INTRAVENOUS

## 2020-09-07 MED ORDER — PHENYLEPHRINE HCL-NACL 10-0.9 MG/250ML-% IV SOLN
INTRAVENOUS | Status: DC | PRN
  Administered 2020-09-07: 50 ug/min via INTRAVENOUS

## 2020-09-07 MED ORDER — LIDOCAINE 2% (20 MG/ML) 5 ML SYRINGE
INTRAMUSCULAR | Status: AC
  Filled 2020-09-07: qty 10

## 2020-09-07 MED ORDER — ONDANSETRON HCL 4 MG PO TABS: 4.0000 mg | ORAL_TABLET | Freq: Four times a day (QID) | ORAL | Status: DC | PRN

## 2020-09-07 MED ORDER — PHENOL 1.4 % MT LIQD: 1.0000 | OROMUCOSAL | Status: DC | PRN

## 2020-09-07 MED ORDER — THROMBIN 5000 UNITS EX SOLR: OROMUCOSAL | Status: DC | PRN

## 2020-09-07 MED ORDER — PHENYLEPHRINE HCL (PRESSORS) 10 MG/ML IV SOLN
INTRAVENOUS | Status: DC | PRN
  Administered 2020-09-07: 100 ug via INTRAVENOUS

## 2020-09-07 MED ORDER — CEFAZOLIN SODIUM-DEXTROSE 2-4 GM/100ML-% IV SOLN
2.0000 g | Freq: Three times a day (TID) | INTRAVENOUS | Status: AC
  Administered 2020-09-07 – 2020-09-08 (×2): 2 g via INTRAVENOUS
  Filled 2020-09-07 (×2): qty 100

## 2020-09-07 MED ORDER — ACETAMINOPHEN 325 MG PO TABS
650.0000 mg | ORAL_TABLET | ORAL | Status: DC | PRN
  Administered 2020-09-07 – 2020-09-09 (×6): 650 mg via ORAL
  Filled 2020-09-07 (×6): qty 2

## 2020-09-07 MED ORDER — PHENYLEPHRINE 40 MCG/ML (10ML) SYRINGE FOR IV PUSH (FOR BLOOD PRESSURE SUPPORT)
PREFILLED_SYRINGE | INTRAVENOUS | Status: AC
  Filled 2020-09-07: qty 10

## 2020-09-07 MED ORDER — MIDAZOLAM HCL 2 MG/2ML IJ SOLN
INTRAMUSCULAR | Status: AC
  Filled 2020-09-07: qty 2

## 2020-09-07 MED ORDER — OXYCODONE HCL 5 MG PO TABS
10.0000 mg | ORAL_TABLET | ORAL | Status: DC | PRN
  Administered 2020-09-07 – 2020-09-09 (×8): 10 mg via ORAL
  Filled 2020-09-07 (×8): qty 2

## 2020-09-07 MED ORDER — LACTATED RINGERS IV SOLN: INTRAVENOUS | Status: DC | PRN

## 2020-09-07 MED ORDER — TRIAMTERENE-HCTZ 37.5-25 MG PO CAPS
1.0000 | ORAL_CAPSULE | Freq: Every morning | ORAL | Status: DC
  Filled 2020-09-07: qty 1

## 2020-09-07 MED ORDER — SUGAMMADEX SODIUM 200 MG/2ML IV SOLN
INTRAVENOUS | Status: DC | PRN
  Administered 2020-09-07 (×2): 100 mg via INTRAVENOUS

## 2020-09-07 MED ORDER — TIMOLOL MALEATE 0.5 % OP SOLN
1.0000 [drp] | Freq: Every morning | OPHTHALMIC | Status: DC
  Administered 2020-09-08 – 2020-09-09 (×2): 1 [drp] via OPHTHALMIC
  Filled 2020-09-07: qty 5

## 2020-09-07 MED ORDER — DOCUSATE SODIUM 100 MG PO CAPS
100.0000 mg | ORAL_CAPSULE | Freq: Two times a day (BID) | ORAL | Status: DC
  Administered 2020-09-07 – 2020-09-09 (×4): 100 mg via ORAL
  Filled 2020-09-07 (×4): qty 1

## 2020-09-07 MED ORDER — OXYCODONE HCL 5 MG PO TABS: 5.0000 mg | ORAL_TABLET | ORAL | Status: DC | PRN

## 2020-09-07 MED ORDER — CEFAZOLIN SODIUM-DEXTROSE 2-4 GM/100ML-% IV SOLN
2.0000 g | INTRAVENOUS | Status: AC
  Administered 2020-09-07 (×2): 2 g via INTRAVENOUS
  Filled 2020-09-07: qty 100

## 2020-09-07 MED ORDER — PROPOFOL 10 MG/ML IV BOLUS
INTRAVENOUS | Status: DC | PRN
  Administered 2020-09-07: 20 mg via INTRAVENOUS
  Administered 2020-09-07: 150 mg via INTRAVENOUS

## 2020-09-07 MED ORDER — MIDAZOLAM HCL 5 MG/5ML IJ SOLN
INTRAMUSCULAR | Status: DC | PRN
  Administered 2020-09-07: 2 mg via INTRAVENOUS

## 2020-09-07 MED ORDER — LATANOPROST 0.005 % OP SOLN
1.0000 [drp] | Freq: Every day | OPHTHALMIC | Status: DC
  Administered 2020-09-07 – 2020-09-08 (×2): 1 [drp] via OPHTHALMIC
  Filled 2020-09-07: qty 2.5

## 2020-09-07 MED ORDER — LIDOCAINE 2% (20 MG/ML) 5 ML SYRINGE
INTRAMUSCULAR | Status: DC | PRN
  Administered 2020-09-07: 30 mg via INTRAVENOUS

## 2020-09-07 MED ORDER — OXYCODONE HCL 5 MG PO TABS: 5.0000 mg | ORAL_TABLET | Freq: Once | ORAL | Status: DC | PRN

## 2020-09-07 MED ORDER — FENTANYL CITRATE (PF) 100 MCG/2ML IJ SOLN
INTRAMUSCULAR | Status: DC | PRN
  Administered 2020-09-07: 50 ug via INTRAVENOUS
  Administered 2020-09-07 (×2): 100 ug via INTRAVENOUS
  Administered 2020-09-07: 50 ug via INTRAVENOUS

## 2020-09-07 MED ORDER — HYDROMORPHONE HCL 1 MG/ML IJ SOLN
1.0000 mg | INTRAMUSCULAR | Status: DC | PRN
  Administered 2020-09-07 – 2020-09-09 (×3): 1 mg via INTRAVENOUS
  Filled 2020-09-07 (×4): qty 1

## 2020-09-07 MED ORDER — LIDOCAINE-EPINEPHRINE 1 %-1:100000 IJ SOLN
INTRAMUSCULAR | Status: AC
  Filled 2020-09-07: qty 1

## 2020-09-07 MED ORDER — CEFAZOLIN SODIUM-DEXTROSE 2-4 GM/100ML-% IV SOLN
INTRAVENOUS | Status: AC
  Filled 2020-09-07: qty 100

## 2020-09-07 MED ORDER — OXYCODONE HCL 5 MG/5ML PO SOLN: 5.0000 mg | Freq: Once | ORAL | Status: DC | PRN

## 2020-09-07 MED ORDER — 0.9 % SODIUM CHLORIDE (POUR BTL) OPTIME
TOPICAL | Status: DC | PRN
  Administered 2020-09-07: 1000 mL

## 2020-09-07 MED ORDER — SODIUM CHLORIDE 0.9% FLUSH: 3.0000 mL | INTRAVENOUS | Status: DC | PRN

## 2020-09-07 MED ORDER — SODIUM CHLORIDE 0.9 % IV SOLN: 250.0000 mL | INTRAVENOUS | Status: DC

## 2020-09-07 MED ORDER — POLYVINYL ALCOHOL 1.4 % OP SOLN: 1.0000 [drp] | OPHTHALMIC | Status: DC | PRN

## 2020-09-07 MED ORDER — FENTANYL CITRATE (PF) 100 MCG/2ML IJ SOLN
INTRAMUSCULAR | Status: AC
  Administered 2020-09-07: 50 ug via INTRAVENOUS
  Filled 2020-09-07: qty 2

## 2020-09-07 MED ORDER — THROMBIN 5000 UNITS EX SOLR
CUTANEOUS | Status: AC
  Filled 2020-09-07: qty 5000

## 2020-09-07 MED ORDER — ATORVASTATIN CALCIUM 40 MG PO TABS
40.0000 mg | ORAL_TABLET | Freq: Every morning | ORAL | Status: DC
  Administered 2020-09-08 – 2020-09-09 (×2): 40 mg via ORAL
  Filled 2020-09-07 (×2): qty 1

## 2020-09-07 MED ORDER — LACTATED RINGERS IV SOLN: INTRAVENOUS | Status: DC

## 2020-09-07 MED ORDER — CYCLOBENZAPRINE HCL 10 MG PO TABS
10.0000 mg | ORAL_TABLET | Freq: Three times a day (TID) | ORAL | Status: DC | PRN
  Administered 2020-09-07 – 2020-09-08 (×2): 10 mg via ORAL
  Filled 2020-09-07 (×2): qty 1

## 2020-09-07 MED ORDER — ACETAMINOPHEN 650 MG RE SUPP: 650.0000 mg | RECTAL | Status: DC | PRN

## 2020-09-07 MED ORDER — CHLORHEXIDINE GLUCONATE 0.12 % MT SOLN
15.0000 mL | Freq: Once | OROMUCOSAL | Status: AC
  Administered 2020-09-07: 15 mL via OROMUCOSAL
  Filled 2020-09-07: qty 15

## 2020-09-07 MED ORDER — ROCURONIUM BROMIDE 10 MG/ML (PF) SYRINGE
PREFILLED_SYRINGE | INTRAVENOUS | Status: AC
  Filled 2020-09-07: qty 20

## 2020-09-07 MED ORDER — ROCURONIUM BROMIDE 10 MG/ML (PF) SYRINGE
PREFILLED_SYRINGE | INTRAVENOUS | Status: DC | PRN
  Administered 2020-09-07: 70 mg via INTRAVENOUS
  Administered 2020-09-07: 10 mg via INTRAVENOUS
  Administered 2020-09-07: 20 mg via INTRAVENOUS
  Administered 2020-09-07: 10 mg via INTRAVENOUS
  Administered 2020-09-07: 20 mg via INTRAVENOUS

## 2020-09-07 MED ORDER — MENTHOL 3 MG MT LOZG
1.0000 | LOZENGE | OROMUCOSAL | Status: DC | PRN
  Administered 2020-09-07: 3 mg via ORAL
  Filled 2020-09-07 (×2): qty 9

## 2020-09-07 MED ORDER — DEXAMETHASONE SODIUM PHOSPHATE 10 MG/ML IJ SOLN
INTRAMUSCULAR | Status: AC
  Filled 2020-09-07: qty 1

## 2020-09-07 MED ORDER — LIDOCAINE-EPINEPHRINE 1 %-1:100000 IJ SOLN
INTRAMUSCULAR | Status: DC | PRN
  Administered 2020-09-07: 10 mL

## 2020-09-07 MED ORDER — SODIUM CHLORIDE 0.9% FLUSH
3.0000 mL | Freq: Two times a day (BID) | INTRAVENOUS | Status: DC
  Administered 2020-09-08: 3 mL via INTRAVENOUS

## 2020-09-07 MED ORDER — ORAL CARE MOUTH RINSE: 15.0000 mL | Freq: Once | OROMUCOSAL | Status: AC

## 2020-09-07 SURGICAL SUPPLY — 71 items
ADH SKN CLS APL DERMABOND .7 (GAUZE/BANDAGES/DRESSINGS) ×1
APL SKNCLS STERI-STRIP NONHPOA (GAUZE/BANDAGES/DRESSINGS)
BAND INSRT 18 STRL LF DISP RB (MISCELLANEOUS)
BAND RUBBER #18 3X1/16 STRL (MISCELLANEOUS) ×2 IMPLANT
BASKET BONE COLLECTION (BASKET) ×2 IMPLANT
BENZOIN TINCTURE PRP APPL 2/3 (GAUZE/BANDAGES/DRESSINGS) IMPLANT
BLADE CLIPPER SURG (BLADE) IMPLANT
BLADE SURG 11 STRL SS (BLADE) ×2 IMPLANT
BUR MATCHSTICK NEURO 3.0 LAGG (BURR) ×2 IMPLANT
BUR PRECISION FLUTE 5.0 (BURR) ×3 IMPLANT
CANISTER SUCT 3000ML PPV (MISCELLANEOUS) ×2 IMPLANT
CNTNR URN SCR LID CUP LEK RST (MISCELLANEOUS) ×1 IMPLANT
CONT SPEC 4OZ STRL OR WHT (MISCELLANEOUS) ×2
COVER BACK TABLE 60X90IN (DRAPES) ×2 IMPLANT
COVER WAND RF STERILE (DRAPES) ×1 IMPLANT
DECANTER SPIKE VIAL GLASS SM (MISCELLANEOUS) ×1 IMPLANT
DERMABOND ADVANCED (GAUZE/BANDAGES/DRESSINGS) ×1
DERMABOND ADVANCED .7 DNX12 (GAUZE/BANDAGES/DRESSINGS) ×1 IMPLANT
DRAPE C-ARM 42X72 X-RAY (DRAPES) IMPLANT
DRAPE C-ARMOR (DRAPES) IMPLANT
DRAPE LAPAROTOMY 100X72X124 (DRAPES) ×2 IMPLANT
DRAPE MICROSCOPE LEICA (MISCELLANEOUS) ×1 IMPLANT
DRAPE SURG 17X23 STRL (DRAPES) ×1 IMPLANT
DURAPREP 26ML APPLICATOR (WOUND CARE) ×2 IMPLANT
ELECT REM PT RETURN 9FT ADLT (ELECTROSURGICAL) ×2
ELECTRODE REM PT RTRN 9FT ADLT (ELECTROSURGICAL) ×1 IMPLANT
GAUZE 4X4 16PLY RFD (DISPOSABLE) IMPLANT
GAUZE SPONGE 4X4 12PLY STRL (GAUZE/BANDAGES/DRESSINGS) IMPLANT
GLOVE BIOGEL PI IND STRL 7.5 (GLOVE) ×2 IMPLANT
GLOVE BIOGEL PI INDICATOR 7.5 (GLOVE) ×2
GLOVE ECLIPSE 7.5 STRL STRAW (GLOVE) ×4 IMPLANT
GLOVE EXAM NITRILE LRG STRL (GLOVE) IMPLANT
GLOVE EXAM NITRILE XL STR (GLOVE) IMPLANT
GLOVE EXAM NITRILE XS STR PU (GLOVE) IMPLANT
GLOVE SRG 8 PF TXTR STRL LF DI (GLOVE) IMPLANT
GLOVE SURG UNDER POLY LF SZ8 (GLOVE) ×2
GOWN STRL REUS W/ TWL LRG LVL3 (GOWN DISPOSABLE) ×4 IMPLANT
GOWN STRL REUS W/ TWL XL LVL3 (GOWN DISPOSABLE) IMPLANT
GOWN STRL REUS W/TWL 2XL LVL3 (GOWN DISPOSABLE) IMPLANT
GOWN STRL REUS W/TWL LRG LVL3 (GOWN DISPOSABLE) ×12
GOWN STRL REUS W/TWL XL LVL3 (GOWN DISPOSABLE)
HEMOSTAT POWDER KIT SURGIFOAM (HEMOSTASIS) ×2 IMPLANT
KIT BASIN OR (CUSTOM PROCEDURE TRAY) ×2 IMPLANT
KIT POSITION SURG JACKSON T1 (MISCELLANEOUS) ×2 IMPLANT
KIT TURNOVER KIT B (KITS) ×2 IMPLANT
MILL MEDIUM DISP (BLADE) IMPLANT
NDL HYPO 18GX1.5 BLUNT FILL (NEEDLE) IMPLANT
NDL SPNL 18GX3.5 QUINCKE PK (NEEDLE) IMPLANT
NEEDLE HYPO 18GX1.5 BLUNT FILL (NEEDLE) IMPLANT
NEEDLE HYPO 22GX1.5 SAFETY (NEEDLE) ×2 IMPLANT
NEEDLE SPNL 18GX3.5 QUINCKE PK (NEEDLE) IMPLANT
NS IRRIG 1000ML POUR BTL (IV SOLUTION) ×2 IMPLANT
PACK LAMINECTOMY NEURO (CUSTOM PROCEDURE TRAY) ×2 IMPLANT
PAD ARMBOARD 7.5X6 YLW CONV (MISCELLANEOUS) ×9 IMPLANT
ROD PED SOLERA 5.5X60 (Rod) ×2 IMPLANT
SCREW 6.5X40 (Screw) ×6 IMPLANT
SCREW SET SOLERA (Screw) ×12 IMPLANT
SCREW SET SOLERA TI5.5 (Screw) IMPLANT
SPACER ELEVATE STD 23X11 (Spacer) ×2 IMPLANT
SPONGE LAP 4X18 RFD (DISPOSABLE) IMPLANT
SPONGE SURGIFOAM ABS GEL 100 (HEMOSTASIS) IMPLANT
STRIP CLOSURE SKIN 1/2X4 (GAUZE/BANDAGES/DRESSINGS) IMPLANT
SUT MNCRL AB 3-0 PS2 18 (SUTURE) ×2 IMPLANT
SUT VIC AB 0 CT1 18XCR BRD8 (SUTURE) ×1 IMPLANT
SUT VIC AB 0 CT1 8-18 (SUTURE) ×2
SUT VIC AB 2-0 CP2 18 (SUTURE) ×2 IMPLANT
SYR 3ML LL SCALE MARK (SYRINGE) IMPLANT
TOWEL GREEN STERILE (TOWEL DISPOSABLE) ×2 IMPLANT
TOWEL GREEN STERILE FF (TOWEL DISPOSABLE) ×2 IMPLANT
TRAY FOLEY MTR SLVR 16FR STAT (SET/KITS/TRAYS/PACK) ×2 IMPLANT
WATER STERILE IRR 1000ML POUR (IV SOLUTION) ×2 IMPLANT

## 2020-09-07 NOTE — Brief Op Note (Signed)
09/07/2020  6:04 PM  PATIENT:  Mitchell Green  70 y.o. male  PRE-OPERATIVE DIAGNOSIS:  Lumbar stenosis with neurogenic claudication  POST-OPERATIVE DIAGNOSIS:  Lumbar stenosis with neurogenic claudication  PROCEDURE:  Procedure(s): Lumbar three- lumbar four, Lumbar four-Lumbar five Open Decompression with transforaminal lumbar interbody fusion (N/A)  SURGEON:  Surgeon(s) and Role:    * Jadene Pierini, MD - Primary    * Dawley, Alan Mulder, DO - Assisting  PHYSICIAN ASSISTANT:   ANESTHESIA:   general  EBL:  725 mL   BLOOD ADMINISTERED:none  DRAINS: none   LOCAL MEDICATIONS USED:  LIDOCAINE   SPECIMEN:  No Specimen  DISPOSITION OF SPECIMEN:  N/A  COUNTS:  YES  TOURNIQUET:  * No tourniquets in log *  DICTATION: .Note written in EPIC  PLAN OF CARE: Admit to inpatient   PATIENT DISPOSITION:  PACU - hemodynamically stable.   Delay start of Pharmacological VTE agent (>24hrs) due to surgical blood loss or risk of bleeding: yes

## 2020-09-07 NOTE — Anesthesia Preprocedure Evaluation (Signed)
Anesthesia Evaluation  Patient identified by MRN, date of birth, ID band Patient awake    Reviewed: Allergy & Precautions, NPO status , Patient's Chart, lab work & pertinent test results  Airway Mallampati: II  TM Distance: >3 FB Neck ROM: Full    Dental  (+) Teeth Intact   Pulmonary neg pulmonary ROS, former smoker,    Pulmonary exam normal        Cardiovascular hypertension,  Rhythm:Regular Rate:Normal     Neuro/Psych negative neurological ROS  negative psych ROS   GI/Hepatic Neg liver ROS, hiatal hernia, GERD  ,  Endo/Other  negative endocrine ROS  Renal/GU negative Renal ROS  negative genitourinary   Musculoskeletal  (+) Arthritis , Osteoarthritis,  Lumbar stenosis with neurogenic claudication   Abdominal (+)  Abdomen: soft. Bowel sounds: normal.  Peds  Hematology negative hematology ROS (+)   Anesthesia Other Findings   Reproductive/Obstetrics                             Anesthesia Physical Anesthesia Plan  ASA: II  Anesthesia Plan: General   Post-op Pain Management:    Induction: Intravenous  PONV Risk Score and Plan: 2 and Ondansetron, Dexamethasone, Midazolam, Treatment may vary due to age or medical condition and Propofol infusion  Airway Management Planned: Mask and Oral ETT  Additional Equipment: Arterial line  Intra-op Plan:   Post-operative Plan: Extubation in OR  Informed Consent: I have reviewed the patients History and Physical, chart, labs and discussed the procedure including the risks, benefits and alternatives for the proposed anesthesia with the patient or authorized representative who has indicated his/her understanding and acceptance.     Dental advisory given  Plan Discussed with: CRNA  Anesthesia Plan Comments:         Anesthesia Quick Evaluation

## 2020-09-07 NOTE — Op Note (Signed)
PATIENT: Mitchell Green  DAY OF SURGERY: 09/07/20   PRE-OPERATIVE DIAGNOSIS:  Lumbar stenosis with neurogenic claudication, lumbar spondylolisthesis   POST-OPERATIVE DIAGNOSIS:  Same   PROCEDURE:  L3-4 / L4-5 open repeat lumbar decompression with facetectomies and foraminotomies, L3-4/L4-5 transforaminal lumbar interbody fusion, L3-4/L4-5 bilateral pedicle screw placement with posterolateral fusion   SURGEON:  Surgeon(s) and Role:    Jadene Pierini, MD - Primary    Dawley, Kendell Bane, MD - Assisting   ANESTHESIA: ETGA   BRIEF HISTORY: This is a 70 year old man who presented with recurrent neurogenic claudication. He previously had a lumbar decompression as well as an L5-S1 ray cage fusion. The patient was found to have recurrent canal stenosis with a mobile spondylolisthesis with refractory symptoms. This was discussed with the patient as well as risks, benefits, and alternatives and wished to proceed with surgery.   OPERATIVE DETAIL: The patient was taken to the operating room and anesthesia was induced by the anesthesia team. They were placed on the OR table in the prone position with padding of all pressure points. A formal time out was performed with two patient identifiers and confirmed the operative site. The operative site was marked, hair was clipped with surgical clippers, the area was then prepped and draped in a sterile fashion.   Fluoro was used to localize the operative level and the patient's prior midline incision was used to expose from L3 to L5. Subperiosteal dissection was performed where possible, significant scar tissue was encountered and carefully removed. Fluoroscopy was again used to confirm the surgical level. Dissection was taken out laterally to expose the facets and then transverse processes.  Decompression was performed, which consisted of repeat laminectomies at L3 and L4 including the L3-4 and L4-5 interlaminar space. Bilateral foraminotomies were then performed  by removing the facets and decompressing the nerve roots along their course. There was significant scar tissue, as expected, which made dissection significantly more difficult. Dr. Jake Samples scrubbed in to assist with this difficult dissection and helped with retraction as well as the central decompression and foraminotomies. Obviously, the amount of decompression performed was greater than what was needed for instrumentation alone. The thecal sac and roots were palpated to confirm that they were well decompressed. Hemostasis was obtained and then confirmed before attention was turned to instrumentation. The bone was saved and morselized to be used as autograft.   Instrumentation was then performed, first starting with interbody devices. The disc space at L3-4 was identified, the thecal sac and nerve root were protected, and an annulotomy was created followed by a discectomy by combination of a shavers, curettes, and rongeurs. The endplates were prepped and an expandable interbody cage was packed with autograft. Autograft was packed into the disc space and the cage was then placed while protecting the thecal sac and nerve roots as well as fluoroscopic guidance. It was positioned, expanded, and released after it was in an acceptable location. This technique was then repeated at L4-5 in the same fashion.   Fluoroscopy was used to guide placement of bilateral pedicle screws ([employer]) at L3, L4, and L5. These were placed by localizing the pedicle with standard landmarks, drilling a pilot hole, cannulating the pedicle with an awl, palpating for pedicle wall breaches, tapping, palpating, and then placing the screw. These were connected with rods bilaterally and final tightened according to manufacturer torque specifications. The bone was thoroughly decorticated over the fusion surfaces along the residual facets and transverse processes. The previously resected bone fragments were  morselized and used as autograft.    All instrument and sponge counts were correct, the incision was then closed in layers. The patient was then returned to anesthesia for emergence. No apparent complications at the completion of the procedure.   EBL:    DRAINS: none   SPECIMENS: none   Jadene Pierini, MD 09/07/20 10:31 AM

## 2020-09-07 NOTE — Transfer of Care (Signed)
Immediate Anesthesia Transfer of Care Note  Patient: Mitchell Green  Procedure(s) Performed: Lumbar three- lumbar four, Lumbar four-Lumbar five Open Decompression with transforaminal lumbar interbody fusion (N/A Spine Lumbar)  Patient Location: PACU  Anesthesia Type:General  Level of Consciousness: drowsy  Airway & Oxygen Therapy: Patient Spontanous Breathing and Patient connected to face mask oxygen  Post-op Assessment: Report given to RN and Post -op Vital signs reviewed and stable  Post vital signs: Reviewed and stable  Last Vitals:  Vitals Value Taken Time  BP 126/67 09/07/20 1811  Temp    Pulse 75 09/07/20 1814  Resp 19 09/07/20 1814  SpO2 94 % 09/07/20 1814  Vitals shown include unvalidated device data.  Last Pain:  Vitals:   09/07/20 0953  TempSrc:   PainSc: 4       Patients Stated Pain Goal: 3 (09/07/20 0953)  Complications: No complications documented.

## 2020-09-07 NOTE — H&P (Signed)
Surgical H&P Update  HPI: 70 y.o. man with a history of recurrent neurogenic claudication due to recurrent lumbar stenosis, here for repeat decompression and instrumented fusion. No changes in health since he was last seen. Still having claudication symptoms and wishes to proceed with surgery.  PMHx:  Past Medical History:  Diagnosis Date  . Arthritis   . GERD (gastroesophageal reflux disease)     not at this time  . Glaucoma   . H/O hiatal hernia   . Hypercholesteremia    under control  . Hypertension   . Spinal stenosis    FamHx:  Family History  Problem Relation Age of Onset  . Heart attack Brother 30   SocHx:  reports that he quit smoking about 13 years ago. His smoking use included cigarettes. He has a 10.00 pack-year smoking history. He has never used smokeless tobacco. He reports previous alcohol use. He reports that he does not use drugs.  Physical Exam: AOx3, PERRL, FS, TM  Strength 5/5 x4, SILTx4 except some left L5>S1 distribution numbness  Assesment/Plan: 70 y.o. man with lumbar stenosis with neurogenic claudication in the setting of adjacent segment disease with spondylolisthesis, here for open L3-4, L4-5 open decompression and TLIF. Risks, benefits, and alternatives discussed and the patient would like to continue with surgery.  -OR today -3C post-op  Jadene Pierini, MD 09/07/20 10:28 AM

## 2020-09-08 ENCOUNTER — Encounter (HOSPITAL_COMMUNITY): Payer: Self-pay | Admitting: Neurological Surgery

## 2020-09-08 MED ORDER — BACLOFEN 10 MG PO TABS
5.0000 mg | ORAL_TABLET | Freq: Three times a day (TID) | ORAL | Status: DC | PRN
  Administered 2020-09-08 (×2): 5 mg via ORAL
  Filled 2020-09-08 (×2): qty 1

## 2020-09-08 MED ORDER — TRIAMTERENE-HCTZ 37.5-25 MG PO TABS
1.0000 | ORAL_TABLET | Freq: Every day | ORAL | Status: DC
  Administered 2020-09-08 – 2020-09-09 (×2): 1 via ORAL
  Filled 2020-09-08 (×2): qty 1

## 2020-09-08 MED ORDER — BACLOFEN 10 MG PO TABS: 5.0000 mg | ORAL_TABLET | Freq: Three times a day (TID) | ORAL | Status: DC | PRN

## 2020-09-08 NOTE — Anesthesia Postprocedure Evaluation (Signed)
Anesthesia Post Note  Patient: Mitchell Green  Procedure(s) Performed: Lumbar three- lumbar four, Lumbar four-Lumbar five Open Decompression with transforaminal lumbar interbody fusion (N/A Spine Lumbar)     Patient location during evaluation: PACU Anesthesia Type: General Level of consciousness: awake and alert Pain management: pain level controlled Vital Signs Assessment: post-procedure vital signs reviewed and stable Respiratory status: spontaneous breathing, nonlabored ventilation, respiratory function stable and patient connected to nasal cannula oxygen Cardiovascular status: blood pressure returned to baseline and stable Postop Assessment: no apparent nausea or vomiting Anesthetic complications: no   No complications documented.  Last Vitals:  Vitals:   09/07/20 2311 09/08/20 0307  BP: (!) 147/58 123/71  Pulse: 70 96  Resp: 20 20  Temp: 36.7 C 37.1 C  SpO2: 99% 96%    Last Pain:  Vitals:   09/08/20 0536  TempSrc:   PainSc: 6                  [name]

## 2020-09-08 NOTE — Progress Notes (Signed)
Neurosurgery Service Progress Note  Subjective: No acute events overnight, ambulated w/ PT this morning with some dizziness and hiccoughs, but no claudication symptoms, no radicular pain   Objective: Vitals:   09/07/20 2019 09/07/20 2311 09/08/20 0307 09/08/20 0810  BP: (!) 142/57 (!) 147/58 123/71 (!) 143/68  Pulse: (!) 54 70 96 93  Resp: 16 20 20 18   Temp: 97.7 F (36.5 C) 98 F (36.7 C) 98.8 F (37.1 C) 98.2 F (36.8 C)  TempSrc: Oral Oral Oral Oral  SpO2: 97% 99% 96% 97%  Weight:      Height:        Physical Exam: AOx3, PERRL, EOMI, FS, TM, Strength 5/5 x4, SILTx4 Incision c/d/i  Assessment & Plan: 70 y.o. man s/p redo redo lumbar decompression and 2 level TLIF, recovering well.  -cont work w/ PT/OT -baclofen prn hiccoughs -likely discharge tomorrow  65  09/08/20 10:44 AM

## 2020-09-08 NOTE — Evaluation (Signed)
Physical Therapy Evaluation Patient Details Name: Mitchell Green MRN: [id number] DOB: 16-Nov-1950 Today's Date: 09/08/2020   History of Present Illness  Pt is a 70 y/o man with a history of recurrent neurogenic claudication due to recurrent lumbar stenosis.  Underwent L3-L5 TLIF on 09/07/20. PMH significant for HTN, glaucoma, prior back surgery (last in 2009).    Clinical Impression  Pt admitted with above diagnosis. At the time of PT eval, pt was able to demonstrate transfers and ambulation with gross min guard assist and RW for support. Pt was educated on precautions, positioning recommendations, appropriate activity progression, and car transfer. Pt currently with functional limitations due to the deficits listed below (see PT Problem List). Pt will benefit from skilled PT to increase their independence and safety with mobility to allow discharge to the venue listed below.      Follow Up Recommendations Home health PT;Supervision for mobility/OOB    Equipment Recommendations  Rolling walker with 5" wheels    Recommendations for Other Services       Precautions / Restrictions Precautions Precautions: Back Precaution Booklet Issued: Yes (comment) Precaution Comments: Reviewed handout with pt and wife and pt was cued for precautions during functional mobility. Restrictions Weight Bearing Restrictions: No      Mobility  Bed Mobility Overal bed mobility: Needs Assistance Bed Mobility: Rolling;Sidelying to Sit Rolling: Min guard Sidelying to sit: Min assist;Mod assist     Sit to sidelying: Min assist General bed mobility comments: VC's for log roll technique. HOB slightly elevated and pt required max use of rails to scoot and push up from. Assist from therapist to elevate trunk to full sitting position and up to mod assist with bed pad to scoot L side out so both feet were on the ground.    Transfers Overall transfer level: Needs assistance Equipment used: Rolling walker (2  wheeled) Transfers: Sit to/from Stand Sit to Stand: Min guard;From elevated surface Stand pivot transfers: Supervision       General transfer comment: VC's for hand placement on seated surface for safety and wide BOS as pt lowering himself down to low recliner chair. Hands-on guarding for safety as pt powered up to full stand.  Ambulation/Gait Ambulation/Gait assistance: Min guard Gait Distance (Feet): 200 Feet Assistive device: Rolling walker (2 wheeled) Gait Pattern/deviations: Step-through pattern;Decreased stride length;Trunk flexed (Decreased floor clearance) Gait velocity: Decreased Gait velocity interpretation: <1.31 ft/sec, indicative of household ambulator General Gait Details: Very slow and guarded. Unsteady at times and multimodal cues provided throughout gait training for closer proximity to walker, improved posture and forward gaze. As pt fatigued, floor clearance and step/stride length decreased.  Stairs Stairs:  (Not able to be attempted this session due to pain and fatigue.)          Wheelchair Mobility    Modified Rankin (Stroke Patients Only)       Balance Overall balance assessment: Needs assistance Sitting-balance support: Feet supported;No upper extremity supported Sitting balance-Leahy Scale: Fair     Standing balance support: Bilateral upper extremity supported Standing balance-Leahy Scale: Poor Standing balance comment: needing RW for suport                             Pertinent Vitals/Pain Pain Assessment: 0-10 Pain Score: 9  Faces Pain Scale: Hurts even more Pain Location: Incision site Pain Descriptors / Indicators: Aching;Burning;Grimacing;Guarding;Operative site guarding Pain Intervention(s): Limited activity within patient's tolerance;Monitored during session;Repositioned    Home Living Family/patient  expects to be discharged to:: Private residence Living Arrangements: Spouse/significant other Available Help at Discharge:  Family;Available 24 hours/day Type of Home: House Home Access: Stairs to enter   Entrance Stairs-Number of Steps: 9 Home Layout: Two level;Laundry or work area in [employer] Equipment: Environmental education officer Comments: Gave away most of his old equipment.    Prior Function Level of Independence: Independent               Hand Dominance   Dominant Hand: Right    Extremity/Trunk Assessment   Upper Extremity Assessment Upper Extremity Assessment: Defer to OT evaluation    Lower Extremity Assessment Lower Extremity Assessment: Generalized weakness;RLE deficits/detail RLE Deficits / Details: As pt fatigued, pt with increased difficulty advancing RLE.    Cervical / Trunk Assessment Cervical / Trunk Assessment: Other exceptions Cervical / Trunk Exceptions: s/p surgery; forward head posture with rounded shoulders  Communication   Communication: No difficulties  Cognition Arousal/Alertness: Awake/alert Behavior During Therapy: WFL for tasks assessed/performed Overall Cognitive Status: Within Functional Limits for tasks assessed                                        General Comments      Exercises     Assessment/Plan    PT Assessment Patient needs continued PT services  PT Problem List Decreased strength;Decreased activity tolerance;Decreased balance;Decreased mobility;Decreased knowledge of use of DME;Decreased safety awareness;Decreased knowledge of precautions;Pain       PT Treatment Interventions DME instruction;Gait training;Stair training;Functional mobility training;Therapeutic activities;Therapeutic exercise;Neuromuscular re-education;Patient/family education    PT Goals (Current goals can be found in the Care Plan section)  Acute Rehab PT Goals Patient Stated Goal: Return home and move a little better PT Goal Formulation: With patient/family Time For Goal Achievement: 09/15/20 Potential to Achieve Goals: Good    Frequency Min  5X/week   Barriers to discharge        Co-evaluation               AM-PAC PT "6 Clicks" Mobility  Outcome Measure Help needed turning from your back to your side while in a flat bed without using bedrails?: A Little Help needed moving from lying on your back to sitting on the side of a flat bed without using bedrails?: A Little Help needed moving to and from a bed to a chair (including a wheelchair)?: A Little Help needed standing up from a chair using your arms (e.g., wheelchair or bedside chair)?: A Little Help needed to walk in hospital room?: A Little Help needed climbing 3-5 steps with a railing? : A Lot 6 Click Score: 17    End of Session Equipment Utilized During Treatment: Gait belt Activity Tolerance: Patient limited by fatigue;Patient limited by pain Patient left: in chair;with call bell/phone within reach;with family/visitor present Nurse Communication: Mobility status PT Visit Diagnosis: Unsteadiness on feet (R26.81);Pain Pain - part of body:  (back)    Time: 1610-9604 PT Time Calculation (min) (ACUTE ONLY): 37 min   Charges:   PT Evaluation $PT Eval Low Complexity: 1 Low PT Treatments $Gait Training: 8-22 mins        Conni Slipper, PT, DPT Acute Rehabilitation Services Pager: [phone] Office: [phone]   Marylynn Pearson 09/08/2020, 1:32 PM

## 2020-09-08 NOTE — Evaluation (Signed)
Occupational Therapy Evaluation Patient Details Name: Mitchell Green MRN: [id number] DOB: 08-30-1950 Today's Date: 09/08/2020    History of Present Illness 70 y.o. man with a history of recurrent neurogenic claudication due to recurrent lumbar stenosis.  Underwent Lumbar three- lumbar four, Lumbar four-Lumbar five Open Decompression with transforaminal lumbar interbody fusion.   Clinical Impression   Patient admitted for the diagnosis and procedure above.  Pain is his biggest barrier at the moment.  OT able to demo hip kit, and he has some pieces already.  Good follow through and understanding of all education performed.  Spouse will be assisting as needed here and at home.  All questions answered, and no further OT needs in the acute setting.      Follow Up Recommendations  No OT follow up    Equipment Recommendations  3 in 1 bedside commode    Recommendations for Other Services       Precautions / Restrictions Precautions Precautions: Back Precaution Booklet Issued: Yes (comment) Restrictions Weight Bearing Restrictions: No      Mobility Bed Mobility Overal bed mobility: Needs Assistance Bed Mobility: Sidelying to Sit;Sit to Sidelying   Sidelying to sit: Supervision     Sit to sidelying: Min assist   Patient Response: Cooperative  Transfers Overall transfer level: Needs assistance Equipment used: Rolling walker (2 wheeled) Transfers: Sit to/from [employer] Sit to Stand: Supervision Stand pivot transfers: Supervision            Balance Overall balance assessment: Needs assistance Sitting-balance support: Feet supported;No upper extremity supported Sitting balance-Leahy Scale: Fair     Standing balance support: Bilateral upper extremity supported Standing balance-Leahy Scale: Poor Standing balance comment: needing RW for suport                           ADL either performed or assessed with clinical judgement   ADL Overall  ADL's : Needs assistance/impaired                 Upper Body Dressing : Minimal assistance;Sitting   Lower Body Dressing: Minimal assistance;Sit to/from stand   Toilet Transfer: Supervision/safety;RW;Ambulation           Functional mobility during ADLs: Supervision/safety;Rolling walker General ADL Comments: slow and painful     Vision Baseline Vision/History: No visual deficits Patient Visual Report: No change from baseline       Perception     Praxis      Pertinent Vitals/Pain Pain Assessment: 0-10 Pain Score: 6  Pain Location: low back incision. Pain Descriptors / Indicators: Aching;Burning;Grimacing;Guarding;Operative site guarding Pain Intervention(s): Monitored during session     Hand Dominance Right   Extremity/Trunk Assessment Upper Extremity Assessment Upper Extremity Assessment: Overall WFL for tasks assessed   Lower Extremity Assessment Lower Extremity Assessment: Defer to PT evaluation   Cervical / Trunk Assessment Cervical / Trunk Assessment: Kyphotic   Communication Communication Communication: No difficulties   Cognition Arousal/Alertness: Awake/alert Behavior During Therapy: WFL for tasks assessed/performed Overall Cognitive Status: Within Functional Limits for tasks assessed                                                      Home Living Family/patient expects to be discharged to:: Private residence Living Arrangements: Spouse/significant other Available Help at Discharge: Family;Available  24 hours/day Type of Home: House Home Access: Stairs to enter [employer] of Steps: 9   Home Layout: Two level;Laundry or work area in Building surveyor of Steps: 15 Alternate Level Stairs-Rails: Left Bathroom Shower/Tub: Occupational psychologist: Standard Bathroom Accessibility: Yes How Accessible: Accessible via walker Home Equipment: Shower seat   Additional Comments:  Gave away most of his old equipment.      Prior Functioning/Environment Level of Independence: Independent                 OT Problem List: Pain      OT Treatment/Interventions:      OT Goals(Current goals can be found in the care plan section) Acute Rehab OT Goals Patient Stated Goal: Return home and move a little better OT Goal Formulation: With patient Time For Goal Achievement: 09/08/20 Potential to Achieve Goals: Good  OT Frequency:     Barriers to D/C:  none noted          Co-evaluation              AM-PAC OT "6 Clicks" Daily Activity     Outcome Measure Help from another person eating meals?: None Help from another person taking care of personal grooming?: None Help from another person toileting, which includes using toliet, bedpan, or urinal?: None Help from another person bathing (including washing, rinsing, drying)?: A Little Help from another person to put on and taking off regular upper body clothing?: A Little Help from another person to put on and taking off regular lower body clothing?: A Little 6 Click Score: 21   End of Session Equipment Utilized During Treatment: Rolling walker  Activity Tolerance: Patient limited by pain Patient left: in bed;with call bell/phone within reach  OT Visit Diagnosis: Pain Pain - Right/Left:  (low back)                Time: 8406-9861 OT Time Calculation (min): 22 min Charges:  OT General Charges $OT Visit: 1 Visit OT Evaluation $OT Eval Moderate Complexity: 1 Mod  09/08/2020  [name], OTR/L  Acute Rehabilitation Services  Office:  [phone]   [name] 09/08/2020, 9:45 AM

## 2020-09-09 MED ORDER — METHOCARBAMOL 500 MG PO TABS: 500.0000 mg | ORAL_TABLET | Freq: Four times a day (QID) | ORAL | 2 refills | Status: DC | PRN

## 2020-09-09 MED ORDER — METHOCARBAMOL 500 MG PO TABS
500.0000 mg | ORAL_TABLET | Freq: Four times a day (QID) | ORAL | Status: DC | PRN
  Administered 2020-09-09 (×2): 500 mg via ORAL
  Filled 2020-09-09 (×2): qty 1

## 2020-09-09 MED ORDER — BACLOFEN 5 MG PO TABS: 5.0000 mg | ORAL_TABLET | Freq: Three times a day (TID) | ORAL | 0 refills | Status: DC | PRN

## 2020-09-09 MED ORDER — DOCUSATE SODIUM 100 MG PO CAPS: 100.0000 mg | ORAL_CAPSULE | Freq: Two times a day (BID) | ORAL | 0 refills | Status: DC

## 2020-09-09 MED ORDER — ASPIRIN EC 81 MG PO TBEC: 81.0000 mg | DELAYED_RELEASE_TABLET | Freq: Every morning | ORAL | 11 refills | Status: DC

## 2020-09-09 MED ORDER — OXYCODONE HCL 5 MG PO TABS: 5.0000 mg | ORAL_TABLET | ORAL | 0 refills | Status: DC | PRN

## 2020-09-09 NOTE — Progress Notes (Signed)
Neurosurgery Service Progress Note  Subjective: No acute events overnight, hiccups resolved, dizziness resolved, back pain improving, no radicular pain  Objective: Vitals:   09/08/20 1939 09/08/20 2304 09/09/20 0342 09/09/20 0737  BP: (!) 143/67 (!) 144/53 139/68 (!) 143/79  Pulse: (!) 103 (!) 102 92 85  Resp: 18 20 18 16   Temp: 98.4 F (36.9 C) 98.6 F (37 C) 98 F (36.7 C) 97.7 F (36.5 C)  TempSrc: Oral Oral Oral Oral  SpO2: 91% 94% 90% 95%  Weight:      Height:        Physical Exam: AOx3, PERRL, EOMI, FS, TM, Strength 5/5 x4, SILTx4 Incision c/d/i  Assessment & Plan: 70 y.o. man s/p redo redo lumbar decompression and 2 level TLIF, recovering well.  -discharge home today  65  09/09/20 9:02 AM

## 2020-09-09 NOTE — Discharge Instructions (Signed)
Discharge Instructions ° °No restriction in activities, slowly increase your activity back to normal.  ° °Your incision is closed with dermabond (purple glue). This will naturally fall off over the next 1-2 weeks.  ° °Okay to shower on the day of discharge. Use regular soap and water and try to be gentle when cleaning your incision.  ° °Follow up with Dr. Riki Berninger in 2 weeks after discharge. If you do not already have a discharge appointment, please call his office at [phone] to schedule a follow up appointment. If you have any concerns or questions, please call the office and let us know. °

## 2020-09-09 NOTE — Progress Notes (Signed)
Physical Therapy Treatment Patient Details Name: Mitchell Green MRN: [id number] DOB: 1950-10-04 Today's Date: 09/09/2020    History of Present Illness Pt is a 70 y/o man with a history of recurrent neurogenic claudication due to recurrent lumbar stenosis.  Underwent L3-L5 TLIF on 09/07/20. PMH significant for HTN, glaucoma, prior back surgery (last in 2009).    PT Comments    Pt progressing well with post-op mobility. He was able to demonstrate transfers and ambulation with gross min guard assist to supervision for safety and RW for support. Pt was educated on precautions, positioning recommendations, appropriate activity progression, and car transfer. Will continue to follow.      Follow Up Recommendations  Home health PT;Supervision for mobility/OOB     Equipment Recommendations  Rolling walker with 5" wheels    Recommendations for Other Services       Precautions / Restrictions Precautions Precautions: Back Precaution Booklet Issued: Yes (comment) Precaution Comments: Reviewed handout with pt and wife and pt was cued for precautions during functional mobility. Restrictions Weight Bearing Restrictions: No    Mobility  Bed Mobility Overal bed mobility: Needs Assistance Bed Mobility: Rolling;Sidelying to Sit Rolling: Supervision Sidelying to sit: Min guard       General bed mobility comments: Pt able to transition to EOB without assistance this session. Increased time and HOB slightly elevated.    Transfers Overall transfer level: Needs assistance Equipment used: Rolling walker (2 wheeled) Transfers: Sit to/from Stand Sit to Stand: Min guard         General transfer comment: Pt demonstrated good hand placement on seated surface for safety. No assist required to power-up to full stand from low bed height.  Ambulation/Gait Ambulation/Gait assistance: Min guard;Supervision Gait Distance (Feet): 300 Feet Assistive device: Rolling walker (2 wheeled) Gait  Pattern/deviations: Step-through pattern;Decreased stride length;Trunk flexed (Decreased floor clearance) Gait velocity: Decreased Gait velocity interpretation: <1.31 ft/sec, indicative of household ambulator General Gait Details: Progressed to supervision level by end of gait training. Pt motivated for distance and was able to improve step/stride length with cues.   Stairs Stairs: Yes Stairs assistance: Min guard Stair Management: One rail Left;Step to pattern;Forwards Number of Stairs: 10 General stair comments: VC's for sequencing and general safety. No assist required but hand-on guarding provided for safety.   Wheelchair Mobility    Modified Rankin (Stroke Patients Only)       Balance Overall balance assessment: Needs assistance Sitting-balance support: Feet supported;No upper extremity supported Sitting balance-Leahy Scale: Fair     Standing balance support: Bilateral upper extremity supported Standing balance-Leahy Scale: Poor Standing balance comment: needing RW for suport                            Cognition Arousal/Alertness: Awake/alert Behavior During Therapy: WFL for tasks assessed/performed Overall Cognitive Status: Within Functional Limits for tasks assessed                                        Exercises      General Comments        Pertinent Vitals/Pain Pain Assessment: Faces Faces Pain Scale: Hurts little more Pain Location: Incision site Pain Descriptors / Indicators: Aching;Operative site guarding Pain Intervention(s): Limited activity within patient's tolerance;Monitored during session;Repositioned    Home Living Family/patient expects to be discharged to:: Private residence Living Arrangements: Spouse/significant other Available Help at  Discharge: Family;Available 24 hours/day Type of Home: House Home Access: Stairs to enter   Home Layout: Two level;Laundry or work area in [employer] Equipment: Careers adviser Comments: Gave away most of his old equipment.    Prior Function Level of Independence: Independent          PT Goals (current goals can now be found in the care plan section) Acute Rehab PT Goals Patient Stated Goal: Return home and move a little better PT Goal Formulation: With patient/family Time For Goal Achievement: 09/15/20 Potential to Achieve Goals: Good Progress towards PT goals: Progressing toward goals    Frequency    Min 5X/week      PT Plan Current plan remains appropriate    Co-evaluation              AM-PAC PT "6 Clicks" Mobility   Outcome Measure  Help needed turning from your back to your side while in a flat bed without using bedrails?: A Little Help needed moving from lying on your back to sitting on the side of a flat bed without using bedrails?: A Little Help needed moving to and from a bed to a chair (including a wheelchair)?: A Little Help needed standing up from a chair using your arms (e.g., wheelchair or bedside chair)?: A Little Help needed to walk in hospital room?: A Little Help needed climbing 3-5 steps with a railing? : A Little 6 Click Score: 18    End of Session Equipment Utilized During Treatment: Gait belt Activity Tolerance: Patient limited by fatigue;Patient limited by pain Patient left: in chair;with call bell/phone within reach Nurse Communication: Mobility status PT Visit Diagnosis: Unsteadiness on feet (R26.81);Pain Pain - part of body:  (back)     Time: 1829-9371 PT Time Calculation (min) (ACUTE ONLY): 27 min  Charges:  $Gait Training: 23-37 mins                     Conni Slipper, PT, DPT Acute Rehabilitation Services Pager: [phone] Office: [phone]    Marylynn Pearson 09/09/2020, 11:13 AM

## 2020-09-09 NOTE — Progress Notes (Signed)
Patient is discharged from room 3C07 at this time. Alert and in stable condition. IV site d/c'd and instructions read to patient and spouse with understanding verbalized and all questions answered. Left unit via wheelchair with all belongings at side. 

## 2020-09-09 NOTE — Discharge Summary (Signed)
Discharge Summary  Date of Admission: 09/07/2020  Date of Discharge: 09/09/20  Attending Physician: Autumn Patty, MD  Hospital Course: Patient was admitted following an uncomplicated repeat lumbar decompression and 2 level TLIF. He was recovered in PACU and transferred to [facility]. His hospital course was uncomplicated, his preop symptoms were significantly improved, and the patient was discharged home on 09/09/20. He will follow up in clinic with me in 2 weeks.  Neurologic exam at discharge:  AOx3, PERRL, EOMI, FS, TM Strength 5/5 x4, SILTx4  Discharge diagnosis: Lumbar stenosis with neurogenic claudication  Jadene Pierini, MD 09/09/20 9:05 AM

## 2022-03-19 IMAGING — RF DG LUMBAR SPINE 2-3V
1 series · 2 of 2 positions shown · non-contrast
Comparison: None.

CLINICAL DATA: L3-L5 fusion

EXAM:
LUMBAR SPINE - 2-3 VIEW; DG C-ARM 1-60 MIN

[Series 1: run · 2 of 2 slices shown]
[im 1/2]
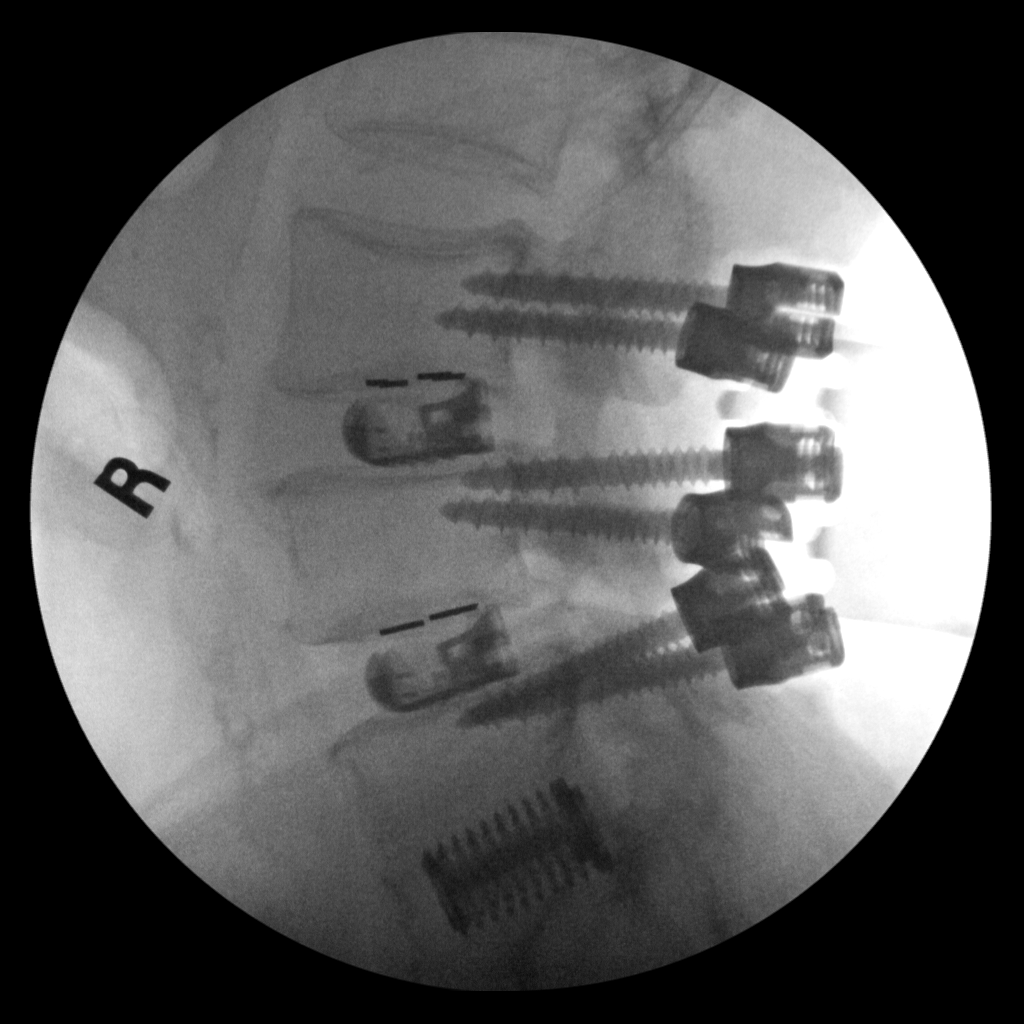
[im 2/2]
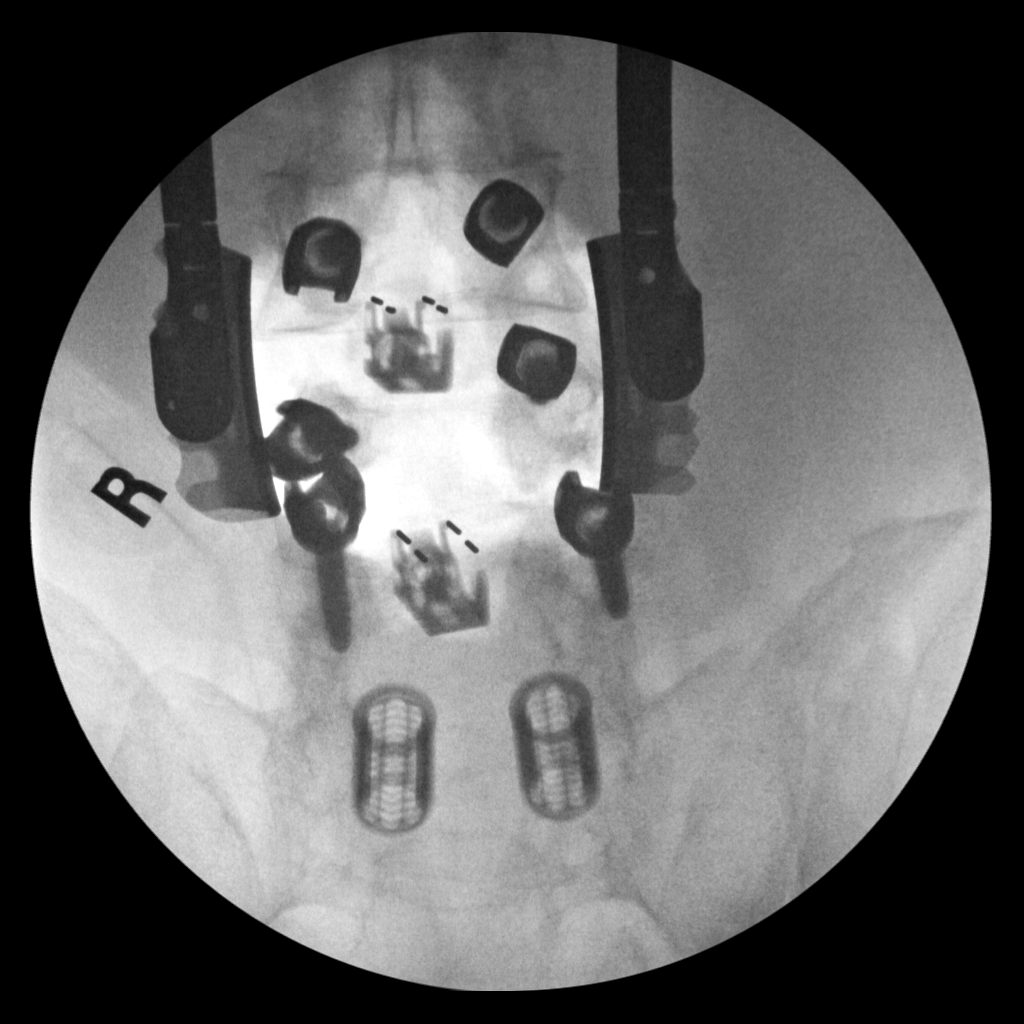

[2 of 2 positions shown; findings below may reference images not displayed]

FINDINGS: Placement of posterior pedicle screws from L3 through L5. No visible
complicating feature.
IMPRESSION: Posterior fusion as above.

## 2023-12-03 ENCOUNTER — Encounter: Payer: Self-pay | Admitting: Emergency Medicine

## 2023-12-03 ENCOUNTER — Ambulatory Visit
Admission: EM | Admit: 2023-12-03 | Discharge: 2023-12-03 | Disposition: A | Discharge status: Home / Self Care | Attending: Family Medicine | Admitting: Family Medicine

## 2023-12-03 DIAGNOSIS — M5030 Other cervical disc degeneration, unspecified cervical region: Secondary | ICD-10-CM | POA: Diagnosis not present

## 2023-12-03 DIAGNOSIS — M5412 Radiculopathy, cervical region: Secondary | ICD-10-CM

## 2023-12-03 MED ORDER — METHOCARBAMOL 750 MG PO TABS: 750.0000 mg | ORAL_TABLET | Freq: Every evening | ORAL | 0 refills | Status: AC

## 2023-12-03 MED ORDER — PREDNISONE 10 MG (21) PO TBPK: ORAL_TABLET | Freq: Every day | ORAL | 0 refills | Status: AC

## 2023-12-03 NOTE — ED Triage Notes (Signed)
 Patient c/o right shoulder, neck and arm pain for several weeks.  No apparent injury.  Patient states he has been doing a lot of yard work.  Patient has taken Tylenol  and Hydrocodone .

## 2023-12-03 NOTE — Discharge Instructions (Signed)
 Try taking the muscle relaxer Robaxin  at bedtime You may continue your gentle stretching exercises Take the prednisone pack as prescribed Follow-up with your spine doctors if you fail to improve

## 2023-12-03 NOTE — ED Provider Notes (Signed)
 [name]    CSN: [id number] Arrival date & time: 12/03/23  1220      History   Chief Complaint Chief Complaint  Patient presents with   Shoulder Pain    HPI Mitchell Green is a 73 y.o. male.   HPI  Patient has known lumbar disc disease and cervical disc disease.  He was treated for a cervical pain with radiculitis and October 2024.  He was seen by his primary care doctor.  Referred to a physiatrist.  Attended rehab.  Took a steroid pack.  States he felt much better.  He now has the gradual onset of neck pain that radiates into his right upper arm and down towards the hand.  It is worse with certain neck movement.  His neck does feel somewhat stiff.  No numbness or weakness into the arm.  He does not know any activity that may have caused this to flare back up again.  He states he does go to the gym with a personal trainer on a regular basis, every other day.  This has been helpful with his low back pain. He had an MRI in 2018.  I reviewed this result.  Patient has extensive degenerative disc disease throughout his cervical spine  Past Medical History:  Diagnosis Date   Arthritis    GERD (gastroesophageal reflux disease)     not at this time   Glaucoma    H/O hiatal hernia    Hypercholesteremia    under control   Hypertension    Spinal stenosis     Patient Active Problem List   Diagnosis Date Noted   Lumbar stenosis with neurogenic claudication 09/07/2020   Spinal stenosis of lumbar region 02/19/2014   Spinal stenosis 02/19/2014    Past Surgical History:  Procedure Laterality Date   BACK SURGERY  last 2009   x3-bulging disc, then disc collapsed=fusion with hip bone transplant, scar tissue   EYE SURGERY     INJECTION KNEE Left 06/05/2012   LUMBAR LAMINECTOMY/DECOMPRESSION MICRODISCECTOMY N/A 02/19/2014   Procedure: MICRO LUMBAR DECOMPRESSION L2-L3, REDO L3-4;  Surgeon: Reyes JAYSON Billing, MD;  Location: [facility];  Service: Orthopedics;  Laterality: N/A;    TOE SURGERY Left 06/06/2011   big toe-break and set back with metal pin   TRANSFORAMINAL LUMBAR INTERBODY FUSION (TLIF) WITH PEDICLE SCREW FIXATION 2 LEVEL N/A 09/07/2020   Procedure: Lumbar three- lumbar four, Lumbar four-Lumbar five Open Decompression with transforaminal lumbar interbody fusion;  Surgeon: Cheryle Debby DELENA, MD;  Location: [facility] OR;  Service: Neurosurgery;  Laterality: N/A;       Home Medications    Prior to Admission medications   Medication Sig Start Date End Date Taking? Authorizing Provider  acetaminophen  (TYLENOL ) 500 MG tablet Take 1,000 mg by mouth every 6 (six) hours as needed (PAIN).   Yes [provider]  atorvastatin  (LIPITOR) 40 MG tablet Take 40 mg by mouth every morning.   Yes [provider]  methocarbamol  (ROBAXIN ) 750 MG tablet Take 1 tablet (750 mg total) by mouth at bedtime. 12/03/23  Yes Maranda Jamee Jacob, MD  predniSONE (STERAPRED UNI-PAK 21 TAB) 10 MG (21) TBPK tablet Take by mouth daily. Take 6 tabs by mouth daily  for 2 days, then 5 tabs for 2 days, then 4 tabs for 2 days, then 3 tabs for 2 days, 2 tabs for 2 days, then 1 tab by mouth daily for 2 days 12/03/23  Yes Maranda Jamee Jacob, MD  triamterene -hydrochlorothiazide  (DYAZIDE ) 37.5-25  MG per capsule Take 1 capsule by mouth every morning.   Yes [provider]  timolol  (TIMOPTIC ) 0.5 % ophthalmic solution Place 1 drop into both eyes in the morning. 08/21/20   [provider]    Family History Family History  Problem Relation Age of Onset   Heart attack Brother 54    Social History Social History   Tobacco Use   Smoking status: Former    Current packs/day: 0.00    Average packs/day: 0.5 packs/day for 20.0 years (10.0 ttl pk-yrs)    Types: Cigarettes    Start date: 06/06/1987    Quit date: 06/06/2007    Years since quitting: 16.5   Smokeless tobacco: Never  Vaping Use   Vaping status: Never Used  Substance Use Topics   Alcohol  use: Not Currently     Comment: social   Drug use: No     Allergies   Latex and Procaine   Review of Systems Review of Systems See HPI  Physical Exam Triage Vital Signs ED Triage Vitals  Encounter Vitals Group     BP 12/03/23 1236 (!) 153/71     Girls Systolic BP Percentile --      Girls Diastolic BP Percentile --      Boys Systolic BP Percentile --      Boys Diastolic BP Percentile --      Pulse Rate 12/03/23 1236 77     Resp 12/03/23 1236 18     Temp 12/03/23 1236 98.9 F (37.2 C)     Temp Source 12/03/23 1236 Oral     SpO2 12/03/23 1236 94 %     Weight 12/03/23 1238 215 lb (97.5 kg)     Height 12/03/23 1238 5' 11 (1.803 m)     Head Circumference --      Peak Flow --      Pain Score 12/03/23 1238 8     Pain Loc --      Pain Education --      Exclude from Growth Chart --    No data found.  Updated Vital Signs BP (!) 153/71 (BP Location: Left Arm)   Pulse 77   Temp 98.9 F (37.2 C) (Oral)   Resp 18   Ht 5' 11 (1.803 m)   Wt 97.5 kg   SpO2 94%   BMI 29.99 kg/m      Physical Exam Constitutional:      General: He is in acute distress.     Appearance: He is well-developed.     Comments: Patient is uncomfortable due to pain.  Stiff guarded movements.  Restricts full movement of neck  HENT:     Head: Normocephalic and atraumatic.   Eyes:     Conjunctiva/sclera: Conjunctivae normal.     Pupils: Pupils are equal, round, and reactive to light.   Neck:     Comments: Tenderness in the right upper body of the trapezius down the right scapula, mild tenderness at the cervical 5-6-7 region to the right. Cardiovascular:     Rate and Rhythm: Normal rate.  Pulmonary:     Effort: Pulmonary effort is normal. No respiratory distress.  Abdominal:     General: There is no distension.     Palpations: Abdomen is soft.   Musculoskeletal:        General: Normal range of motion.     Cervical back: Normal range of motion.   Skin:    General: Skin is warm and dry.   Neurological:  General: No focal deficit present.     Mental Status: He is alert.     Sensory: No sensory deficit.     Motor: No weakness.     Gait: Gait normal.     Deep Tendon Reflexes: Reflexes normal.     Comments: Range of motion reflexes strength intact in both upper extremities.     UC Treatments / Results  Labs (all labs ordered are listed, but only abnormal results are displayed) Labs Reviewed - No data to display  EKG   Radiology No results found.  Procedures Procedures (including critical care time)  Medications Ordered in UC Medications - No data to display  Initial Impression / Assessment and Plan / UC Course  I have reviewed the triage vital signs and the nursing notes.  Pertinent labs & imaging results that were available during my care of the patient were reviewed by me and considered in my medical decision making (see chart for details).     Reviewed with patient that this is a flareup of his known cervical disc disease.  I will provide a steroid pack and muscle relaxers for bedtime use.  Follow-up with his usual spine providers Final Clinical Impressions(s) / UC Diagnoses   Final diagnoses:  Cervical radiculitis  Degenerative disc disease, cervical     Discharge Instructions      Try taking the muscle relaxer Robaxin  at bedtime You may continue your gentle stretching exercises Take the prednisone pack as prescribed Follow-up with your spine doctors if you fail to improve   ED Prescriptions     Medication Sig Dispense Auth. Provider   predniSONE (STERAPRED UNI-PAK 21 TAB) 10 MG (21) TBPK tablet Take by mouth daily. Take 6 tabs by mouth daily  for 2 days, then 5 tabs for 2 days, then 4 tabs for 2 days, then 3 tabs for 2 days, 2 tabs for 2 days, then 1 tab by mouth daily for 2 days 42 tablet Maranda Jamee Jacob, MD   methocarbamol  (ROBAXIN ) 750 MG tablet Take 1 tablet (750 mg total) by mouth at bedtime. 30 tablet Maranda Jamee Jacob, MD      I have reviewed  the PDMP during this encounter.   Maranda Jamee Jacob, MD 12/03/23 [phone]
